# Patient Record
Sex: Female | Born: 1945
Health system: Southern US, Community
[De-identification: ages and names within clinical notes are randomized; demographics above are authoritative.]

## PROBLEM LIST (undated history)

## (undated) DIAGNOSIS — I10 Essential (primary) hypertension: Secondary | ICD-10-CM

## (undated) DIAGNOSIS — R740 Nonspecific elevation of levels of transaminase and lactic acid dehydrogenase [LDH]: Secondary | ICD-10-CM

## (undated) DIAGNOSIS — L570 Actinic keratosis: Secondary | ICD-10-CM

## (undated) DIAGNOSIS — R7402 Elevation of levels of lactic acid dehydrogenase (LDH): Secondary | ICD-10-CM

## (undated) DIAGNOSIS — E039 Hypothyroidism, unspecified: Secondary | ICD-10-CM

## (undated) DIAGNOSIS — F988 Other specified behavioral and emotional disorders with onset usually occurring in childhood and adolescence: Secondary | ICD-10-CM

## (undated) DIAGNOSIS — M1990 Unspecified osteoarthritis, unspecified site: Secondary | ICD-10-CM

## (undated) DIAGNOSIS — C4491 Basal cell carcinoma of skin, unspecified: Secondary | ICD-10-CM

## (undated) HISTORY — PX: REPLACEMENT TOTAL HIP W/  RESURFACING IMPLANTS: SUR1222

## (undated) HISTORY — DX: Actinic keratosis: L57.0

## (undated) HISTORY — DX: Other specified behavioral and emotional disorders with onset usually occurring in childhood and adolescence: F98.8

## (undated) HISTORY — DX: Basal cell carcinoma of skin, unspecified: C44.91

## (undated) HISTORY — PX: OTHER SURGICAL HISTORY: SHX169

---

## 1998-10-29 ENCOUNTER — Other Ambulatory Visit: Admission: RE | Admit: 1998-10-29 | Discharge: 1998-10-29 | Payer: Self-pay | Admitting: Obstetrics and Gynecology

## 1998-10-29 ENCOUNTER — Ambulatory Visit (HOSPITAL_COMMUNITY): Admission: RE | Admit: 1998-10-29 | Discharge: 1998-10-29 | Payer: Self-pay | Admitting: Vascular Surgery

## 1999-01-24 ENCOUNTER — Other Ambulatory Visit: Admission: RE | Admit: 1999-01-24 | Discharge: 1999-01-24 | Payer: Self-pay | Admitting: Obstetrics and Gynecology

## 2000-03-24 ENCOUNTER — Other Ambulatory Visit: Admission: RE | Admit: 2000-03-24 | Discharge: 2000-03-24 | Payer: Self-pay | Admitting: Obstetrics and Gynecology

## 2000-08-06 ENCOUNTER — Encounter: Payer: Self-pay | Admitting: Obstetrics and Gynecology

## 2000-08-06 ENCOUNTER — Ambulatory Visit (HOSPITAL_COMMUNITY): Admission: RE | Admit: 2000-08-06 | Discharge: 2000-08-06 | Payer: Self-pay | Admitting: Obstetrics and Gynecology

## 2001-10-25 ENCOUNTER — Encounter: Payer: Self-pay | Admitting: Obstetrics and Gynecology

## 2001-10-25 ENCOUNTER — Ambulatory Visit (HOSPITAL_COMMUNITY): Admission: RE | Admit: 2001-10-25 | Discharge: 2001-10-25 | Payer: Self-pay | Admitting: Obstetrics and Gynecology

## 2002-03-11 ENCOUNTER — Encounter: Admission: RE | Admit: 2002-03-11 | Discharge: 2002-03-11 | Payer: Self-pay | Admitting: Sports Medicine

## 2002-05-20 ENCOUNTER — Encounter: Admission: RE | Admit: 2002-05-20 | Discharge: 2002-05-20 | Payer: Self-pay | Admitting: Sports Medicine

## 2002-09-19 ENCOUNTER — Other Ambulatory Visit: Admission: RE | Admit: 2002-09-19 | Discharge: 2002-09-19 | Payer: Self-pay | Admitting: Obstetrics and Gynecology

## 2003-11-09 ENCOUNTER — Ambulatory Visit (HOSPITAL_COMMUNITY): Admission: RE | Admit: 2003-11-09 | Discharge: 2003-11-09 | Payer: Self-pay | Admitting: Obstetrics and Gynecology

## 2004-11-22 ENCOUNTER — Ambulatory Visit (HOSPITAL_COMMUNITY): Admission: RE | Admit: 2004-11-22 | Discharge: 2004-11-22 | Payer: Self-pay | Admitting: Obstetrics and Gynecology

## 2005-11-25 ENCOUNTER — Ambulatory Visit (HOSPITAL_COMMUNITY): Admission: RE | Admit: 2005-11-25 | Discharge: 2005-11-25 | Payer: Self-pay | Admitting: Obstetrics and Gynecology

## 2006-12-01 ENCOUNTER — Ambulatory Visit (HOSPITAL_COMMUNITY): Admission: RE | Admit: 2006-12-01 | Discharge: 2006-12-01 | Payer: Self-pay | Admitting: Obstetrics and Gynecology

## 2007-12-02 ENCOUNTER — Ambulatory Visit (HOSPITAL_COMMUNITY): Admission: RE | Admit: 2007-12-02 | Discharge: 2007-12-02 | Payer: Self-pay | Admitting: Obstetrics and Gynecology

## 2008-08-17 ENCOUNTER — Inpatient Hospital Stay (HOSPITAL_COMMUNITY): Admission: RE | Admit: 2008-08-17 | Discharge: 2008-08-20 | Payer: Self-pay | Admitting: Orthopedic Surgery

## 2008-12-04 ENCOUNTER — Ambulatory Visit (HOSPITAL_COMMUNITY): Admission: RE | Admit: 2008-12-04 | Discharge: 2008-12-04 | Payer: Self-pay | Admitting: Obstetrics and Gynecology

## 2009-04-18 ENCOUNTER — Other Ambulatory Visit: Admission: RE | Admit: 2009-04-18 | Discharge: 2009-04-18 | Payer: Self-pay | Admitting: Family Medicine

## 2009-04-23 ENCOUNTER — Ambulatory Visit (HOSPITAL_COMMUNITY): Admission: RE | Admit: 2009-04-23 | Discharge: 2009-04-23 | Payer: Self-pay | Admitting: Family Medicine

## 2009-12-06 ENCOUNTER — Ambulatory Visit (HOSPITAL_COMMUNITY): Admission: RE | Admit: 2009-12-06 | Discharge: 2009-12-06 | Payer: Self-pay | Admitting: Obstetrics and Gynecology

## 2010-08-02 IMAGING — CR DG CHEST 2V
2 series · 2 of 2 positions shown · non-contrast
Comparison: None available.

CLINICAL DATA: Osteoarthritis of the left hip.  Preop for surgery.

CHEST - 2 VIEW

[w chest pa]
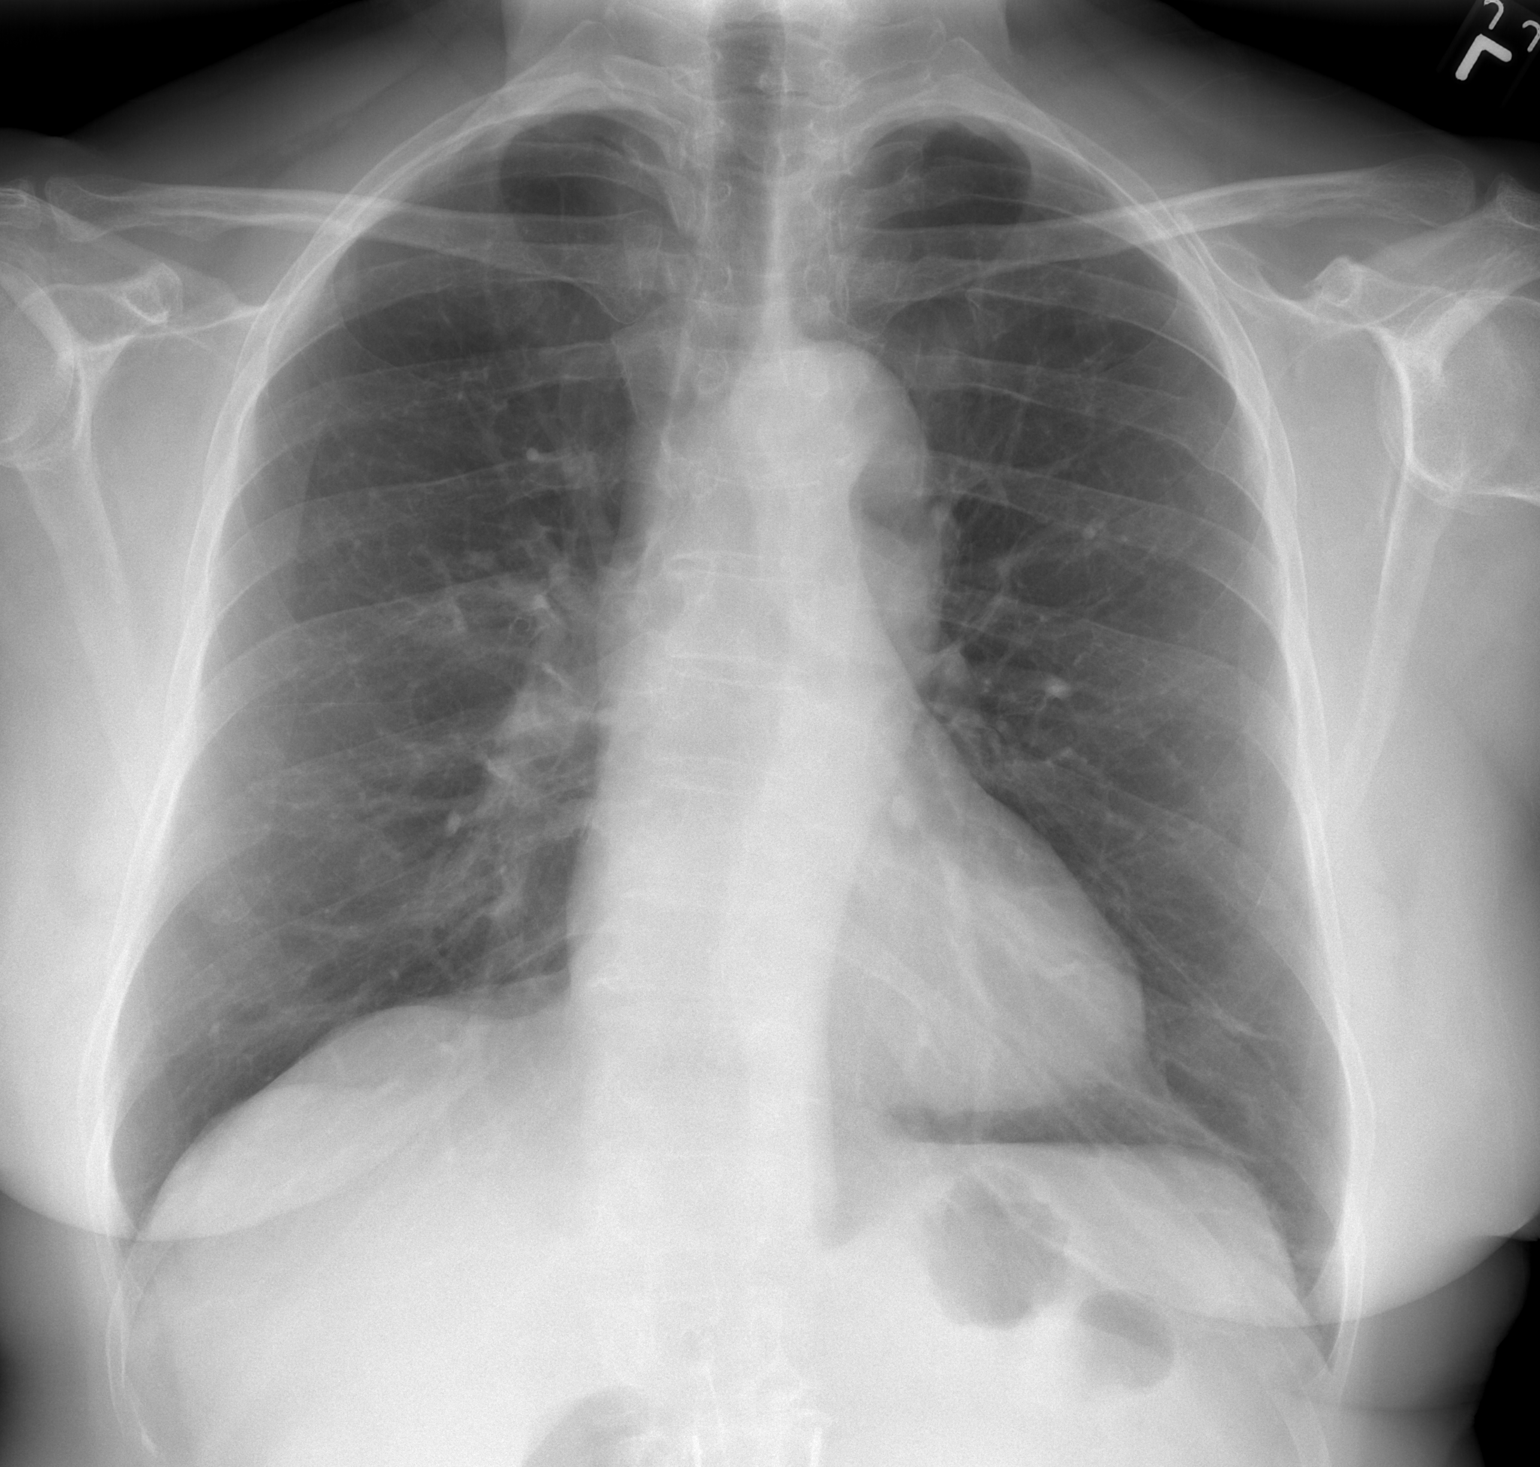

[w chest lat]
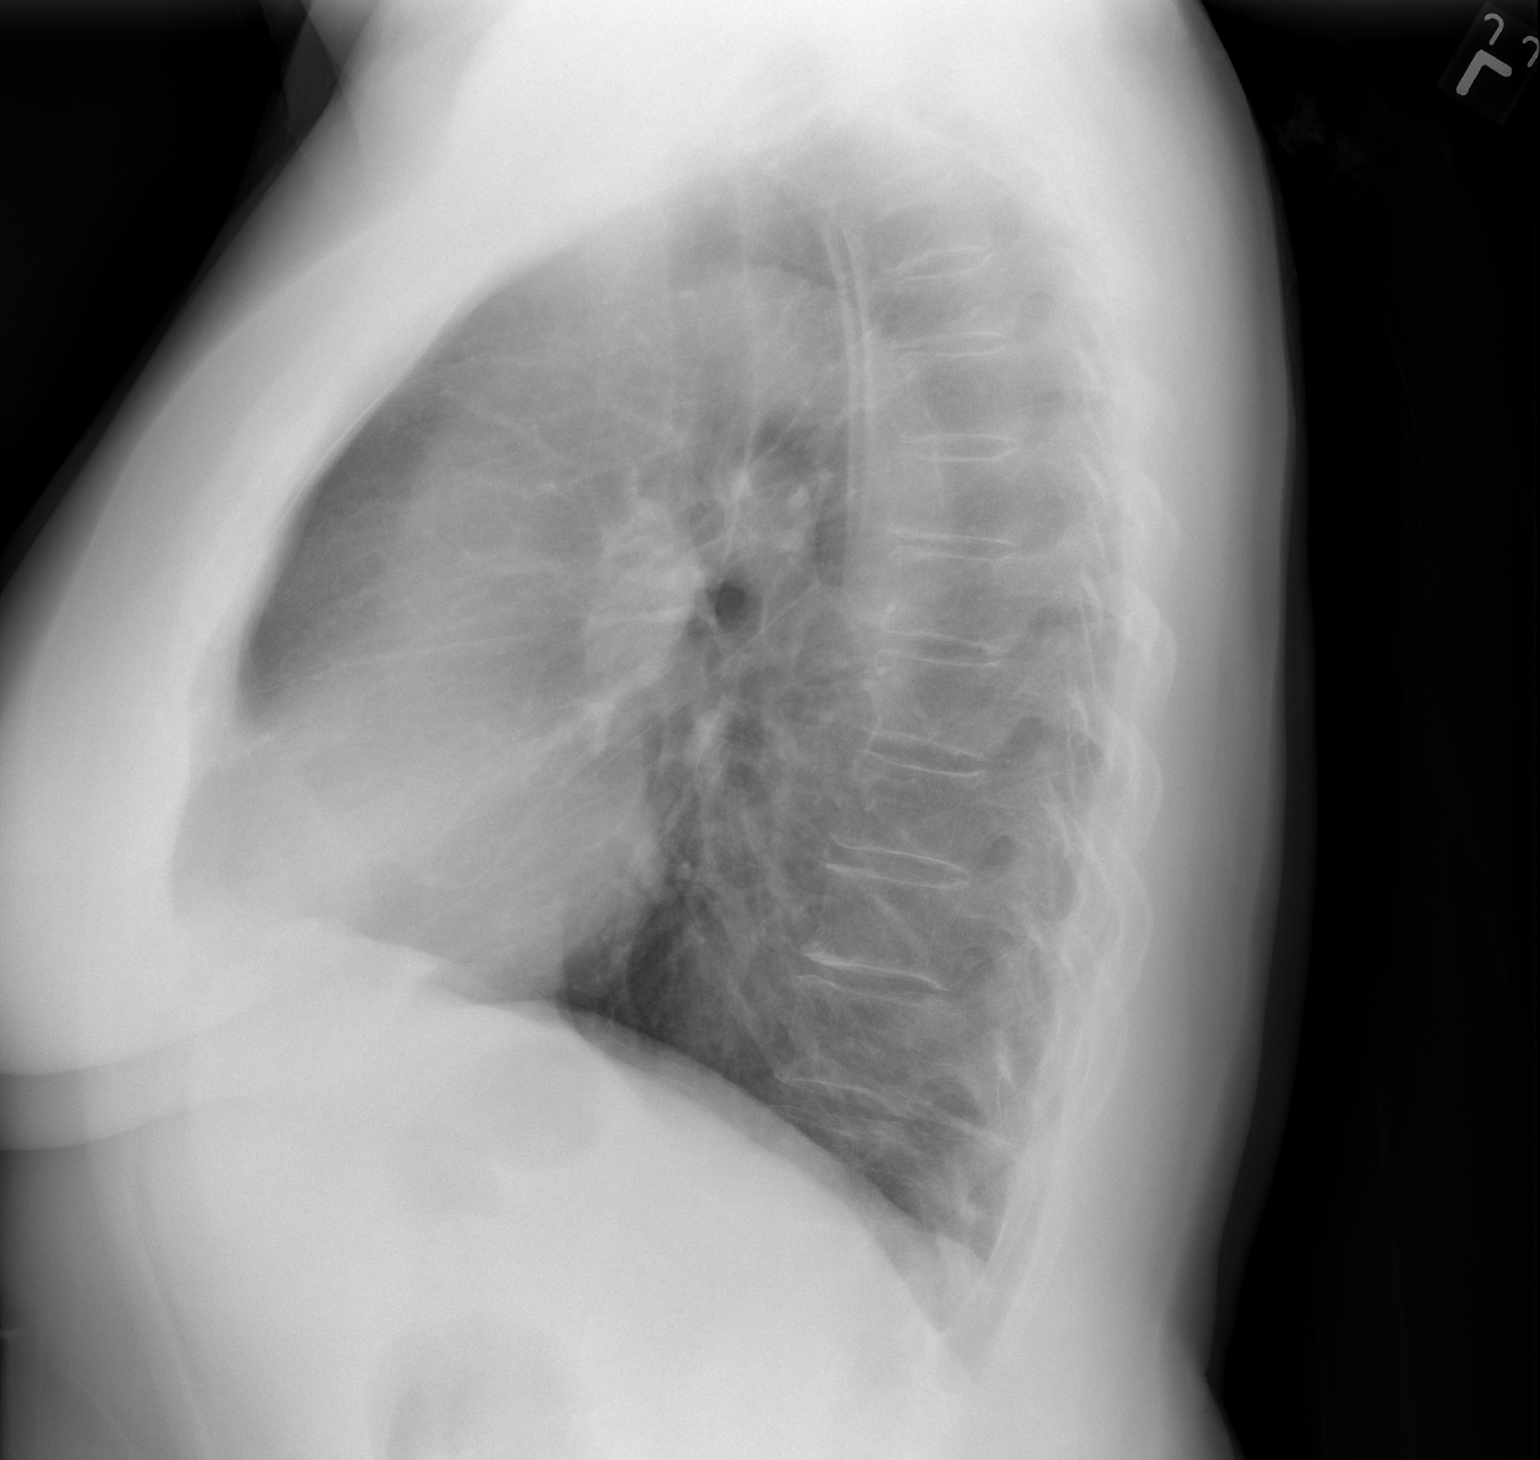

[2 of 2 positions shown; findings below may reference images not displayed]

FINDINGS: The cardiopericardial silhouette is within normal limits
for size.  Mild changes of COPD are evident.  No focal airspace
disease is noted.  A remote fracture of the left fifth rib is
evident.  The visualized soft tissues and bony thorax are otherwise
unremarkable.
IMPRESSION: 1.  No acute cardiopulmonary disease.
2.  Changes compatible with COPD.

## 2010-08-05 IMAGING — CR DG PORTABLE PELVIS
1 series · 1 of 1 positions shown · non-contrast
Comparison: None

CLINICAL DATA: Hip replacement

PORTABLE PELVIS

[view not recorded]
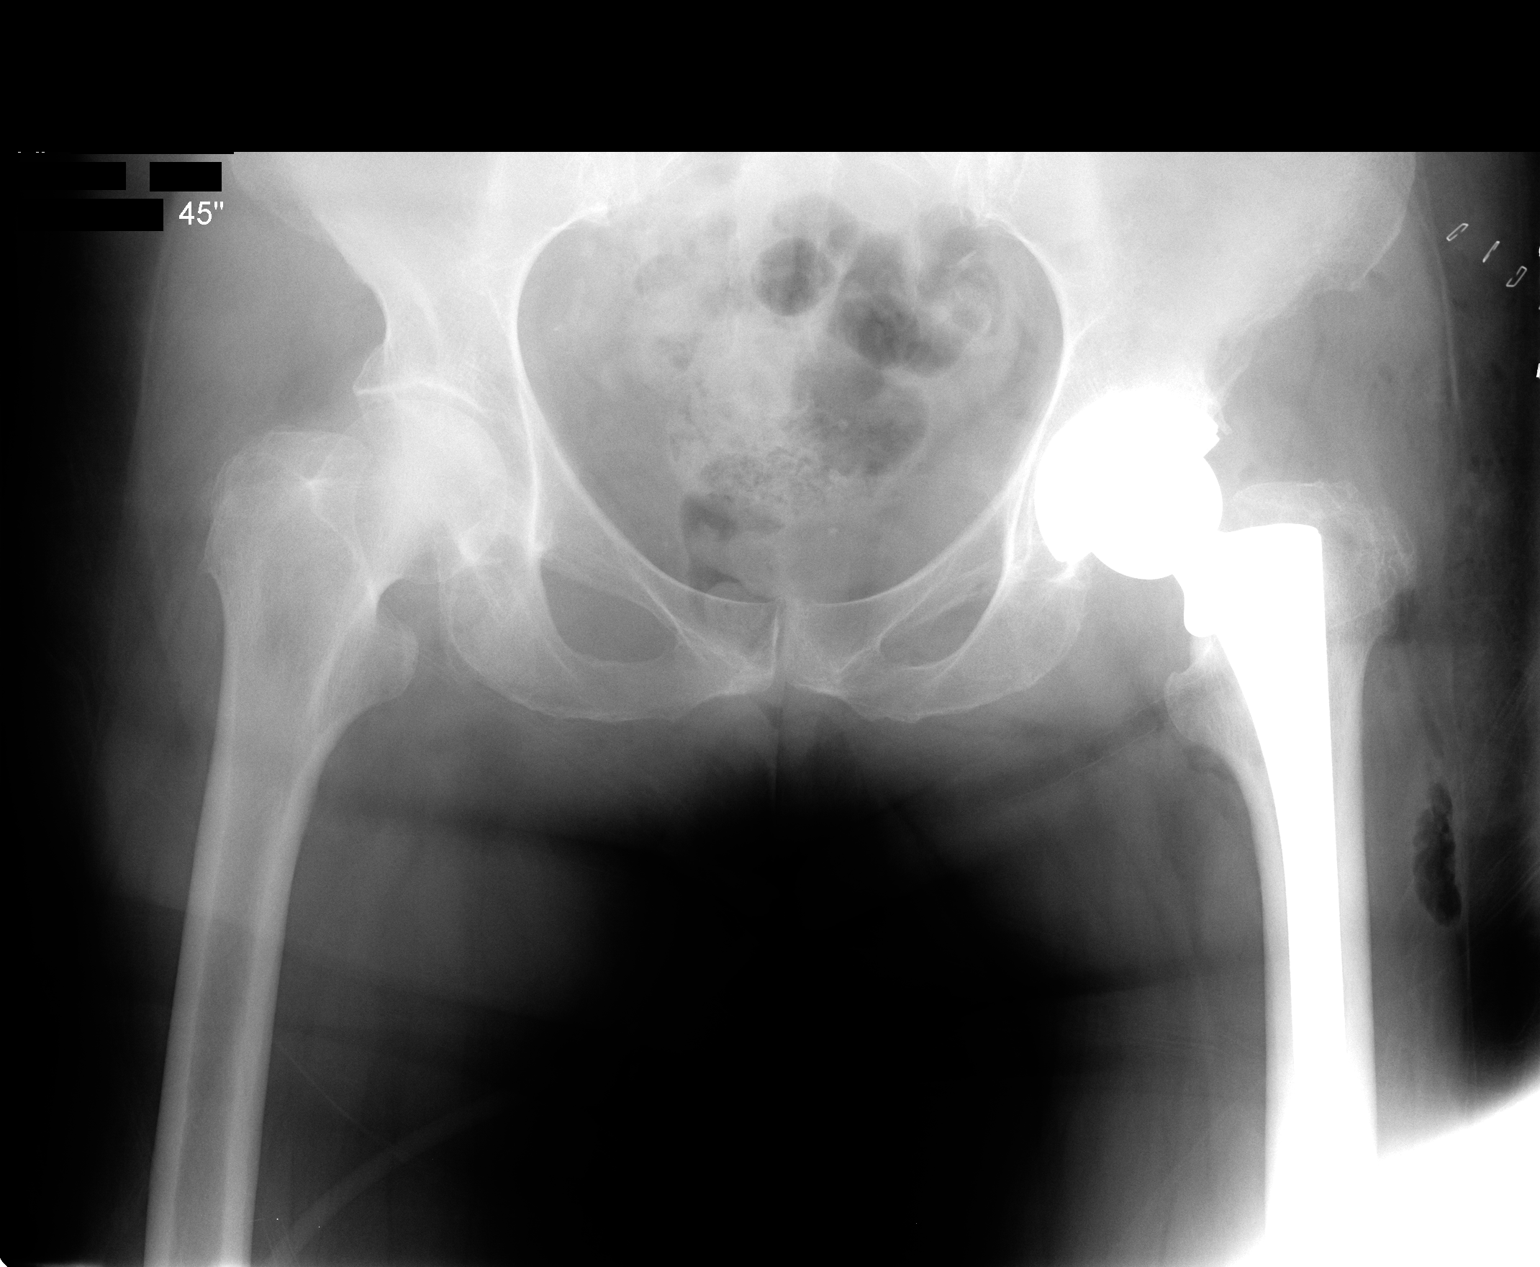

[1 of 1 positions shown; findings below may reference images not displayed]

FINDINGS: Left total hip arthroplasty is anatomically aligned.  No
breakage or loosening of the hardware.
IMPRESSION: Left total hip arthroplasty anatomically aligned.

## 2010-08-14 LAB — URINALYSIS, ROUTINE W REFLEX MICROSCOPIC
Bilirubin Urine: NEGATIVE
Ketones, ur: NEGATIVE mg/dL
Nitrite: POSITIVE — AB
Specific Gravity, Urine: 1.014 (ref 1.005–1.030)
Urobilinogen, UA: 0.2 mg/dL (ref 0.0–1.0)
pH: 6.5 (ref 5.0–8.0)

## 2010-08-14 LAB — CROSSMATCH: Antibody Screen: NEGATIVE

## 2010-08-14 LAB — DIFFERENTIAL
Basophils Relative: 1 % (ref 0–1)
Eosinophils Absolute: 0.3 10*3/uL (ref 0.0–0.7)
Lymphs Abs: 1.7 10*3/uL (ref 0.7–4.0)
Monocytes Absolute: 0.5 10*3/uL (ref 0.1–1.0)
Monocytes Relative: 7 % (ref 3–12)

## 2010-08-14 LAB — CBC
HCT: 23.7 % — ABNORMAL LOW (ref 36.0–46.0)
HCT: 29.6 % — ABNORMAL LOW (ref 36.0–46.0)
Hemoglobin: 8.1 g/dL — ABNORMAL LOW (ref 12.0–15.0)
Hemoglobin: 9.4 g/dL — ABNORMAL LOW (ref 12.0–15.0)
Hemoglobin: 13.6 g/dL (ref 12.0–15.0)
MCHC: 34.2 g/dL (ref 30.0–36.0)
MCHC: 34.4 g/dL (ref 30.0–36.0)
MCV: 92.2 fL (ref 78.0–100.0)
Platelets: 172 10*3/uL (ref 150–400)
Platelets: 223 10*3/uL (ref 150–400)
RBC: 2.91 MIL/uL — ABNORMAL LOW (ref 3.87–5.11)
RDW: 12.7 % (ref 11.5–15.5)
RDW: 14.1 % (ref 11.5–15.5)
RDW: 12.5 % (ref 11.5–15.5)
WBC: 8.9 10*3/uL (ref 4.0–10.5)

## 2010-08-14 LAB — BASIC METABOLIC PANEL
BUN: 3 mg/dL — ABNORMAL LOW (ref 6–23)
BUN: 4 mg/dL — ABNORMAL LOW (ref 6–23)
Chloride: 103 mEq/L (ref 96–112)
Chloride: 103 mEq/L (ref 96–112)
Creatinine, Ser: 0.5 mg/dL (ref 0.4–1.2)
GFR calc non Af Amer: >60 mL/min (ref >60)
Glucose, Bld: 118 mg/dL — ABNORMAL HIGH (ref 70–99)
Potassium: 3.7 mEq/L (ref 3.5–5.1)
Potassium: 4.3 mEq/L (ref 3.5–5.1)
Sodium: 136 mEq/L (ref 135–145)

## 2010-08-14 LAB — COMPREHENSIVE METABOLIC PANEL
ALT: 16 U/L (ref 0–35)
AST: 20 U/L (ref 0–37)
Albumin: 3.9 g/dL (ref 3.5–5.2)
Alkaline Phosphatase: 49 U/L (ref 39–117)
Chloride: 104 mEq/L (ref 96–112)
Potassium: 4.1 mEq/L (ref 3.5–5.1)
Sodium: 137 mEq/L (ref 135–145)
Total Bilirubin: 0.7 mg/dL (ref 0.3–1.2)

## 2010-08-14 LAB — ABO/RH: ABO/RH(D): A POS

## 2010-09-17 NOTE — Op Note (Signed)
NAMEELDORA, NAPP           ACCOUNT NO.:  [id number]   MEDICAL RECORD NO.:  [id number]          PATIENT TYPE:  INP   LOCATION:  0003                         FACILITY:  [facility]   PHYSICIAN:  John L. Rendall, M.D.  DATE OF BIRTH:  [birth year]   DATE OF PROCEDURE:  08/17/2008  DATE OF DISCHARGE:                               OPERATIVE REPORT   PREOPERATIVE DIAGNOSIS:  Osteoarthritis, left hip.   SURGICAL PROCEDURES:  Left AML total hip replacement.   POSTOPERATIVE DIAGNOSIS:  Osteoarthritis, left hip.   SURGEON:  John L. Rendall, M.D.   ASSISTANT:  Legrand Pitts. Duffy, PA-C, present and participating in the  entire procedure.   ANESTHESIA:  Spinal.   PATHOLOGY:  Bone against bone, left hip, with no unusual features noted  in the femoral head or the acetabulum, simply a worn-out hip.   PROCEDURE:  Under spinal anesthesia, the patient was placed in the right  lateral decubitus position and the hip was prepared with DuraPrep and  draped as a sterile field.  Posterior approach was made, splitting the  skin and subcutaneous tissue and the IT band in the line of its fibers.  A Charnley retractor is then inserted.  The short external rotators and  hip capsule are then taken down from the bone and the hip capsule is  opened.  The hip is dislocated.  The arthritis is confirmed.  The  superior femoral neck is exposed.  The IM initiator and canal finder are  used.  Progressive reaming to 13 mm is then done.  The femoral neck is  then osteotomized.  Rasps are used, 10/5 narrow, 12 narrow, 12 large,  and 13/5 large.  Calcar reamer is done to appropriately fine tune the  calcar.  Once this side is completed, attention is turned to the  acetabulum.  Two cobra retractors are placed inferiorly.  A Hibbs is  used superiorly until the 2 wings are put in place in the interval  between the capsule and the labrum.  With the wings in place, the labrum  is excised.  The ligamentum teres is found and  removed in the fovea.  The debridement is completed and reaming is then commenced with 43, 45,  47, 49, 51 reamers in sequence.  Trial seating of a 50 bottoms out  nicely, 52 seems to have a good rim fit.  At this point, the Pinnacle  acetabular cup 100 series, size 52, is inserted using the Sputnik to  assist in placement.  Basically, the anatomic alignment of the native  acetabulum is used for positioning as well.  This gives an excellent  solid scratch fit once fully inserted.  Trial seating of a Pinnacle  acetabular liner +4, 10-degree lip is then done with the rasp, 13/5  large, and a +1.5/36-mm hip ball.  With all of these in place, the hip  is stable through full normal range of motion.  Shuck test under spinal  is just about 8 mm.  The hip is fully stable with flexion and internal  rotation to 45 degrees, when you go to unit of 60 degrees, it has  a  tendency to sublux.  There is no evidence of impingement of the proximal  femur on the acetabulum in the normal position.  At this point, the  permanent components are obtained.  The apex hole eliminator is inserted  in the cup.  The Pinnacle Marathon acetabular liner is inserted, 36 mm  inside, 52 mm outside diameter.  The metal-on-metal femoral head  +1.5  with a 12/14 taper is then inserted, +36-mm hip ball.  The standard  large stature AML stem size 13.5 is then inserted.  An approximate 20-  degree anteversion is used.  The hip ball is then attached in the usual  manner once the stem is seated and the hip is reduced.  Range of motion  is again tested and found to be stable.  Hip capsule is then sutured  with #1 Tycron, the piriformis is put back with #1 Tycron, short  external rotators are loosely  resutured with #1 Tycron, and the IT band is closed with #1 Tycron  mattress sutures.  Subcu was closed in 2 layers with #1 Vicryl and 2-0  Vicryl, skin clips are applied.  Operative time about an hour and 10  minutes.  Leg lengths are  equal at the end of the case.  The patient  returned to recovery in good condition.      John L. Rendall, M.D.  Electronically Signed     JLR/MEDQ  D:  08/17/2008  T:  08/17/2008  Job:  [id number]

## 2010-09-20 NOTE — Discharge Summary (Signed)
Vanessa Heath, Vanessa Heath           ACCOUNT NO.:  [id number]   MEDICAL RECORD NO.:  [id number]          PATIENT TYPE:  INP   LOCATION:  1602                         FACILITY:  [facility]   PHYSICIAN:  John L. Rendall, M.D.  DATE OF BIRTH:  30-Apr-1946   DATE OF ADMISSION:  08/17/2008  DATE OF DISCHARGE:  08/20/2008                               DISCHARGE SUMMARY   ADMISSION DIAGNOSES:  1. End-stage osteoarthritis left hip.  2. Hypertension.  3. Hypothyroid.  4. Hypercholesterolemia.  5. History of benign positional vertigo.   DISCHARGE DIAGNOSES:  1. End-stage osteoarthritis left hip status post left total hip      arthroplasty.  2. Acute blood loss anemia secondary to surgery.  3. Hypertension.  4. Hypothyroidism.  5. Hypercholesterolemia.  6. History of benign positional vertigo.   SURGICAL PROCEDURES:  On August 17, 2008 Vanessa Heath underwent a left  total hip arthroplasty by Dr. Jonny Ruiz L.  Rendall assisted by Arnoldo Morale  PA-C.  Vanessa Heath had a Pinnacle 100 series acetabular cup size 52 mm with an  apex hole eliminator placed with a Pinnacle Marathon acetabular liner  +410 degrees, size #36 inner diameter, 52 mm outer diameter.  An AML  large stature 160 mm length, 45 mm offset size 13.5 femoral stem with a  metal-on-metal femoral head, +1.5 neck length, 36 mm head, 12-14 taper.   COMPLICATIONS:  None.   CONSULTS:  1. Case management consult August 18, 2008.  2. Physical therapy consult August 18, 2008.  3. Occupational therapy consult August 19, 2008.   HISTORY OF PRESENT ILLNESS:  A Vanessa Heath patient  presented to Vanessa Heath with a 80-month history of gradual onset  progressive left hip pain.  The pain is constant, sharp to nagging  sensation in the left groin and lateral trochanter with radiation into  the foot.  Pain increases with walking and decreases with Ultram and  decrease in activity.  Vanessa Heath cannot sleep on the left side.  No difficulty  with socks and shoes.   Vanessa Heath has had cortisone shots without relief.  Vanessa Heath  has failed conservative treatment, and because of that Vanessa Heath is presenting  for a left hip replacement.   HOSPITAL COURSE:  Vanessa Heath tolerated her surgical procedure well  without immediate postoperative complications.  Vanessa Heath was transferred to  the orthopedic floor.  Postop day #1 T-max 98.4, vitals were stable.  Hemoglobin 9.4, hematocrit 27.5.  Leg was neurovascularly intact.  Minimal drainage on her Mepilex dressing.  Her Foley was DC'd after  therapy, and Vanessa Heath was started on physical therapy per protocol.   Postop day #2 Vanessa Heath had a little dizziness and nausea with ambulation, and  her hemoglobin had dropped to 8 with hematocrit of 23.  Vanessa Heath was  subsequently transfused with 2 units of packed red blood cells.  Pulse  was also elevated at 108.  Vanessa Heath was continued on therapy.   Vanessa Heath tolerated her transfusion well.  On postop day #3, Vanessa Heath was feeling  much better.  Hemoglobin had improved to 10 with a 29 hematocrit.  It is  felt Vanessa Heath was stable for  discharge home, and was DC'd home later that  day.   DISCHARGE INSTRUCTIONS:  Diet:  Vanessa Heath can resume her regular  prehospitalization diet.   MEDICATIONS:  Vanessa Heath may resume her home meds as follows:   1. Lisinopril 20 mg p.o. q.a.m.  2. Levothroid 137 mcg 1 tablet p.o. q.a.m.  3. Vytorin 10/20 mg 1 tablet p.o. q.a.m.  4. Tramadol Vanessa Heath is to hold at this time while on other pain meds.   ADDITIONAL MEDICATIONS AT THIS TIME:  1. Arixtra 2.5 mg subcu q. 8 p.m. with last dose on April 24.  On the      28th Vanessa Heath is to start 1 baby aspirin by mouth daily for 1 month.  2. Norco 5/325 one to two tablets p.o. q.4 hours p.r.n. for pain, 60      with no refill.  3. Robaxin 500 mg 1-2 tablets p.o. q.6 hours p.r.n. for spasms, 60      with no refill.   ACTIVITY:  Vanessa Heath can be out of bed weightbearing as tolerated on the left  leg with the use of the  walker.  No lifting or driving for 6 weeks.  Vanessa Heath can increase  activity slowly.  Please see the blue total hip  discharge sheet for further activity instructions.   WOUND CARE:  Vanessa Heath may shower after no drainage from the wound for 2 days.  Please see the blue total hip discharge sheet for further wound care  instructions.   FOLLOWUP:  Vanessa Heath needs to follow up with Vanessa Heath in our office on  Tuesday, April 27, and needs to call [phone] for that appointment.   LABORATORY DATA:  Hemoglobin/hematocrit ranged from 13.6 and 40.2 on the  12th to 8.1 and 23.7 on the 17th to 10.2 and 29.6 on the 18th.  Glucose  ranged from 88 on the 12th to 122 on the 16th to 105 on the 18th.  Potassium ranged from 4.1 on the 12th to 3.3 on the 18th. Sodium went  from 137 on the 12th to 133 on the 17th, and BUN and creatinine went  from 8 and 0.57 on the 12th to 4 and 0.35 on the 17th to 3 and 0.5 on  the 18th.   Calcium dropped to a low of 8.0 on the 17th, and then went to 8.1.  Urinalysis on April 12 showed small hemoglobin, positive nitrate, large  leukocyte esterase, too numerous to count white cells, and many  bacteria.  Vanessa Heath was kept on Keflex postoperatively a bit longer due to  UTI.      Vanessa Heath, P.A.      John L. Rendall, M.D.  Electronically Signed    KED/MEDQ  D:  08/30/2008  T:  08/30/2008  Job:  [id number]

## 2010-11-25 ENCOUNTER — Other Ambulatory Visit (HOSPITAL_COMMUNITY): Payer: Self-pay | Admitting: Family Medicine

## 2010-11-25 DIAGNOSIS — Z1231 Encounter for screening mammogram for malignant neoplasm of breast: Secondary | ICD-10-CM

## 2010-12-09 ENCOUNTER — Ambulatory Visit (HOSPITAL_COMMUNITY)
Admission: RE | Admit: 2010-12-09 | Discharge: 2010-12-09 | Disposition: A | Discharge status: Home / Self Care | Payer: 59 | Source: Ambulatory Visit | Attending: Family Medicine | Admitting: Family Medicine

## 2010-12-09 DIAGNOSIS — Z1231 Encounter for screening mammogram for malignant neoplasm of breast: Secondary | ICD-10-CM | POA: Insufficient documentation

## 2011-11-26 ENCOUNTER — Other Ambulatory Visit (HOSPITAL_COMMUNITY): Payer: Self-pay | Admitting: Family Medicine

## 2011-11-26 DIAGNOSIS — Z1231 Encounter for screening mammogram for malignant neoplasm of breast: Secondary | ICD-10-CM

## 2011-11-27 ENCOUNTER — Ambulatory Visit (HOSPITAL_COMMUNITY)
Admission: RE | Admit: 2011-11-27 | Discharge: 2011-11-27 | Disposition: A | Discharge status: Home / Self Care | Payer: 59 | Source: Ambulatory Visit | Attending: Family Medicine | Admitting: Family Medicine

## 2011-11-27 DIAGNOSIS — Z1231 Encounter for screening mammogram for malignant neoplasm of breast: Secondary | ICD-10-CM | POA: Insufficient documentation

## 2012-11-08 ENCOUNTER — Other Ambulatory Visit (HOSPITAL_COMMUNITY): Payer: Self-pay | Admitting: Family Medicine

## 2012-11-08 DIAGNOSIS — Z1231 Encounter for screening mammogram for malignant neoplasm of breast: Secondary | ICD-10-CM

## 2012-11-29 ENCOUNTER — Ambulatory Visit (HOSPITAL_COMMUNITY)
Admission: RE | Admit: 2012-11-29 | Discharge: 2012-11-29 | Disposition: A | Discharge status: Home / Self Care | Payer: 59 | Source: Ambulatory Visit | Attending: Family Medicine | Admitting: Family Medicine

## 2012-11-29 DIAGNOSIS — Z1231 Encounter for screening mammogram for malignant neoplasm of breast: Secondary | ICD-10-CM | POA: Insufficient documentation

## 2013-10-24 ENCOUNTER — Other Ambulatory Visit (HOSPITAL_COMMUNITY): Payer: Self-pay | Admitting: Family Medicine

## 2013-10-24 DIAGNOSIS — Z1231 Encounter for screening mammogram for malignant neoplasm of breast: Secondary | ICD-10-CM

## 2013-12-01 ENCOUNTER — Ambulatory Visit (HOSPITAL_COMMUNITY)
Admission: RE | Admit: 2013-12-01 | Discharge: 2013-12-01 | Disposition: A | Discharge status: Home / Self Care | Payer: Medicare Other | Source: Ambulatory Visit | Attending: Family Medicine | Admitting: Family Medicine

## 2013-12-01 DIAGNOSIS — Z1231 Encounter for screening mammogram for malignant neoplasm of breast: Secondary | ICD-10-CM | POA: Insufficient documentation

## 2014-11-21 ENCOUNTER — Other Ambulatory Visit (HOSPITAL_COMMUNITY): Payer: Self-pay | Admitting: Family Medicine

## 2014-11-21 DIAGNOSIS — Z1231 Encounter for screening mammogram for malignant neoplasm of breast: Secondary | ICD-10-CM

## 2014-12-04 ENCOUNTER — Ambulatory Visit (HOSPITAL_COMMUNITY)
Admission: RE | Admit: 2014-12-04 | Discharge: 2014-12-04 | Disposition: A | Discharge status: Home / Self Care | Payer: Medicare Other | Source: Ambulatory Visit | Attending: Family Medicine | Admitting: Family Medicine

## 2014-12-04 DIAGNOSIS — Z1231 Encounter for screening mammogram for malignant neoplasm of breast: Secondary | ICD-10-CM | POA: Insufficient documentation

## 2015-11-05 ENCOUNTER — Ambulatory Visit (INDEPENDENT_AMBULATORY_CARE_PROVIDER_SITE_OTHER): Payer: Medicare Other | Admitting: Internal Medicine

## 2015-11-05 VITALS — BP 120/82 | HR 86 | Temp 98.3°F | Resp 18 | Ht 62.0 in | Wt 170.0 lb

## 2015-11-05 DIAGNOSIS — S61402A Unspecified open wound of left hand, initial encounter: Secondary | ICD-10-CM

## 2015-11-05 DIAGNOSIS — Z23 Encounter for immunization: Secondary | ICD-10-CM | POA: Diagnosis not present

## 2015-11-05 DIAGNOSIS — S61255A Open bite of left ring finger without damage to nail, initial encounter: Secondary | ICD-10-CM | POA: Diagnosis not present

## 2015-11-05 DIAGNOSIS — W540XXA Bitten by dog, initial encounter: Secondary | ICD-10-CM

## 2015-11-05 MED ORDER — AMOXICILLIN-POT CLAVULANATE 875-125 MG PO TABS: 1.0000 | ORAL_TABLET | Freq: Two times a day (BID) | ORAL | Status: DC

## 2015-11-05 NOTE — Progress Notes (Signed)
PROCEDURE NOTE: laceration repair Verbal consent obtained from patient.  Local anesthesia with 4cc Lidocaine 2% with epinephrine.  Wound explored for tendon, ligament damage. Wound scrubbed with soap and water and rinsed. Wound closed with #3 4-0 Ethilon simple interrupted sutures.  Wound cleansed and dressed.  Benjiman CoreBrittany Brogen Duell, PA-C  [facility] 11/05/2015 7:31 PM

## 2015-11-05 NOTE — Progress Notes (Signed)
   Subjective:    Patient ID: Vanessa Heath, female    DOB: [birth year], 70 y.o.   MRN: [id number]  HPI Chief Complaint  Patient presents with  . Finger Injury    dog bite today   1 h ago  Finger 4 L palmar Known dog with all shots provoked in dog's territory  No recent Td  Review of Systems [place]    Objective:   Physical Exam BP 120/82 mmHg  Pulse 86  Temp(Src) 98.3 F (36.8 C) (Oral)  Resp 18  Ht 5\' 2"  (1.575 m)  Wt 170 lb (77.111 kg)  BMI 31.09 kg/m2  SpO2 96% There is a wound on the volar aspect of the left fourth finger  over the proximal middle phalanx The wound is gaping with that exposure the no exposure of tendon or joint There is full flexion against resistance without pain or loss of function Range of motion is normal No distal sensory loss There is a long superficial scratch on the dorsum of this fourth finger      Assessment & Plan:  Wound finger Dog bite  Meds ordered this encounter  Medications  . amoxicillin-clavulanate (AUGMENTIN) 875-125 MG tablet    Sig: Take 1 tablet by mouth 2 (two) times daily.    Dispense:  14 tablet    Refill:  0   Wound care  SR 7-10d

## 2015-11-05 NOTE — Patient Instructions (Addendum)
   WOUND CARE Please return in 7-10 days to have your stitches/staples removed or sooner if you have concerns. . Keep area clean and dry for 24 hours. Do not remove bandage, if applied. . After 24 hours, remove bandage and wash wound gently with mild soap and warm water. Reapply a new bandage after cleaning wound, if directed. . Continue daily cleansing with soap and water until stitches/staples are removed. . Do not apply any ointments or creams to the wound while stitches/staples are in place, as this may cause delayed healing. . Notify the office if you experience any of the following signs of infection: Swelling, redness, pus drainage, streaking, fever >101.0 F . Notify the office if you experience excessive bleeding that does not stop after 15-20 minutes of constant, firm pressure.    IF you received an x-ray today, you will receive an invoice from [facility]. Please contact [facility] at [phone] with questions or concerns regarding your invoice.   IF you received labwork today, you will receive an invoice from [facility]. Please contact [facility] at [phone] with questions or concerns regarding your invoice.   Our billing staff will not be able to assist you with questions regarding bills from these companies.  You will be contacted with the lab results as soon as they are available. The fastest way to get your results is to activate your My Chart account. Instructions are located on the last page of this paperwork. If you have not heard from us regarding the results in 2 weeks, please contact this office.      

## 2015-11-14 ENCOUNTER — Other Ambulatory Visit: Payer: Self-pay | Admitting: Family Medicine

## 2015-11-14 DIAGNOSIS — Z1231 Encounter for screening mammogram for malignant neoplasm of breast: Secondary | ICD-10-CM

## 2015-12-05 ENCOUNTER — Ambulatory Visit
Admission: RE | Admit: 2015-12-05 | Discharge: 2015-12-05 | Disposition: A | Discharge status: Home / Self Care | Payer: Medicare Other | Source: Ambulatory Visit | Attending: Family Medicine | Admitting: Family Medicine

## 2015-12-05 DIAGNOSIS — Z1231 Encounter for screening mammogram for malignant neoplasm of breast: Secondary | ICD-10-CM

## 2016-11-07 ENCOUNTER — Other Ambulatory Visit: Payer: Self-pay | Admitting: Family Medicine

## 2016-11-07 DIAGNOSIS — Z1231 Encounter for screening mammogram for malignant neoplasm of breast: Secondary | ICD-10-CM

## 2016-12-09 ENCOUNTER — Ambulatory Visit
Admission: RE | Admit: 2016-12-09 | Discharge: 2016-12-09 | Disposition: A | Discharge status: Home / Self Care | Payer: Medicare Other | Source: Ambulatory Visit | Attending: Family Medicine | Admitting: Family Medicine

## 2016-12-09 DIAGNOSIS — Z1231 Encounter for screening mammogram for malignant neoplasm of breast: Secondary | ICD-10-CM

## 2017-04-17 ENCOUNTER — Emergency Department (HOSPITAL_COMMUNITY): Payer: Medicare Other

## 2017-04-17 ENCOUNTER — Ambulatory Visit: Payer: Self-pay | Admitting: *Deleted

## 2017-04-17 ENCOUNTER — Other Ambulatory Visit: Payer: Self-pay

## 2017-04-17 ENCOUNTER — Emergency Department (HOSPITAL_COMMUNITY)
Admission: EM | Admit: 2017-04-17 | Discharge: 2017-04-17 | Disposition: A | Discharge status: Home / Self Care | Payer: Medicare Other | Attending: Emergency Medicine | Admitting: Emergency Medicine

## 2017-04-17 ENCOUNTER — Encounter (HOSPITAL_COMMUNITY): Payer: Self-pay

## 2017-04-17 DIAGNOSIS — Y939 Activity, unspecified: Secondary | ICD-10-CM | POA: Insufficient documentation

## 2017-04-17 DIAGNOSIS — S0181XA Laceration without foreign body of other part of head, initial encounter: Secondary | ICD-10-CM | POA: Insufficient documentation

## 2017-04-17 DIAGNOSIS — W01198A Fall on same level from slipping, tripping and stumbling with subsequent striking against other object, initial encounter: Secondary | ICD-10-CM | POA: Diagnosis not present

## 2017-04-17 DIAGNOSIS — Z79899 Other long term (current) drug therapy: Secondary | ICD-10-CM | POA: Insufficient documentation

## 2017-04-17 DIAGNOSIS — I10 Essential (primary) hypertension: Secondary | ICD-10-CM | POA: Insufficient documentation

## 2017-04-17 DIAGNOSIS — Y92009 Unspecified place in unspecified non-institutional (private) residence as the place of occurrence of the external cause: Secondary | ICD-10-CM | POA: Diagnosis not present

## 2017-04-17 DIAGNOSIS — Y998 Other external cause status: Secondary | ICD-10-CM | POA: Insufficient documentation

## 2017-04-17 DIAGNOSIS — Z96649 Presence of unspecified artificial hip joint: Secondary | ICD-10-CM | POA: Insufficient documentation

## 2017-04-17 DIAGNOSIS — S0990XA Unspecified injury of head, initial encounter: Secondary | ICD-10-CM | POA: Diagnosis present

## 2017-04-17 HISTORY — DX: Essential (primary) hypertension: I10

## 2017-04-17 HISTORY — DX: Elevation of levels of lactic acid dehydrogenase (LDH): R74.02

## 2017-04-17 HISTORY — DX: Nonspecific elevation of levels of transaminase and lactic acid dehydrogenase (ldh): R74.0

## 2017-04-17 HISTORY — DX: Unspecified osteoarthritis, unspecified site: M19.90

## 2017-04-17 MED ORDER — BACITRACIN ZINC 500 UNIT/GM EX OINT
TOPICAL_OINTMENT | CUTANEOUS | Status: AC
  Filled 2017-04-17: qty 0.9

## 2017-04-17 MED ORDER — CEPHALEXIN 500 MG PO CAPS: 500.0000 mg | ORAL_CAPSULE | Freq: Four times a day (QID) | ORAL | 0 refills | Status: DC

## 2017-04-17 MED ORDER — LIDOCAINE-EPINEPHRINE (PF) 2 %-1:200000 IJ SOLN
20.0000 mL | Freq: Once | INTRAMUSCULAR | Status: AC
  Administered 2017-04-17: 20 mL
  Filled 2017-04-17: qty 20

## 2017-04-17 NOTE — ED Provider Notes (Signed)
..  Laceration Repair Date/Time: 04/17/2017 8:44 PM Performed by: Shaune PollackIsaacs, Keric Zehren, MD Authorized by: Shaune PollackIsaacs, Lawerence Dery, MD   Consent:    Consent obtained:  Verbal   Consent given by:  Patient   Risks discussed:  Infection, need for additional repair, pain, tendon damage, retained foreign body, vascular damage, poor cosmetic result, poor wound healing and nerve damage   Alternatives discussed:  Referral and delayed treatment Anesthesia (see MAR for exact dosages):    Anesthesia method:  Local infiltration   Local anesthetic:  Lidocaine 1% WITH epi Laceration details:    Location:  Face   Face location:  Forehead   Length (cm):  8 Repair type:    Repair type:  Intermediate Pre-procedure details:    Preparation:  Patient was prepped and draped in usual sterile fashion and imaging obtained to evaluate for foreign bodies Exploration:    Hemostasis achieved with:  Direct pressure   Wound exploration: wound explored through full range of motion and entire depth of wound probed and visualized     Wound extent: fascia violated and muscle damage     Wound extent: no underlying fracture noted     Contaminated: no   Treatment:    Area cleansed with:  Betadine   Amount of cleaning:  Extensive   Irrigation solution:  Sterile water   Irrigation method:  Pressure wash Subcutaneous repair:    Suture size:  4-0   Suture material:  Vicryl   Suture technique:  Simple interrupted   Number of sutures:  3 Skin repair:    Repair method:  Sutures   Suture size:  5-0   Suture material:  Prolene   Suture technique:  Simple interrupted   Number of sutures:  19 Approximation:    Approximation:  Close   Vermilion border: well-aligned   Post-procedure details:    Dressing:  Antibiotic ointment   Patient tolerance of procedure:  Tolerated well, no immediate complications      Shaune PollackIsaacs, Abeera Flannery, MD 04/17/17 2045

## 2017-04-17 NOTE — ED Notes (Signed)
Bacitracin applied per dr Penne Lashissacs

## 2017-04-17 NOTE — ED Provider Notes (Signed)
[facility]-EMERGENCY DEPT Provider Note   CSN: [id number] Arrival date & time: 04/17/17  1024     History   Chief Complaint Chief Complaint  Patient presents with  . forehead laceration    HPI Vanessa Heath is a 71 y.o. female.  HPI  Patient is a 71 year old female with a history of arthritis and hypertension presenting for a forehead laceration after a fall.  Patient reports that her shoes got caught on the rug at her friend's house and she fell forward and hit her head on a granite countertop.  There is no presyncopal feeling, dizziness, or lightheadedness.  Patient did not lose consciousness.  Patient does not have a headache at present.  Ice was applied immediately to the site.  Physically, patient does not have any blurred vision, double vision, numbness, tingling, weakness in extremities, nausea, vomiting.  Patient denies neck pain.  Patient does take diclofenac, but no blood thinning medications.  Tetanus shot is up-to-date within 2 years.  Past Medical History:  Diagnosis Date  . Arthritis   . Elevated LDH   . Hypertension     There are no active problems to display for this patient.   Past Surgical History:  Procedure Laterality Date  . REPLACEMENT TOTAL HIP W/  RESURFACING IMPLANTS    . skin cancer removal      OB History    No data available       Home Medications    Prior to Admission medications   Medication Sig Start Date End Date Taking? Authorizing Provider  acetaminophen (TYLENOL) 500 MG tablet Take 500-1,000 mg by mouth 2 (two) times daily as needed.   Yes [provider]  cycloSPORINE (RESTASIS) 0.05 % ophthalmic emulsion Place 1 drop into both eyes 2 (two) times daily.   Yes [provider]  diclofenac (VOLTAREN) 75 MG EC tablet Take 75 mg by mouth 2 (two) times daily as needed. 01/26/17  Yes [provider]  ezetimibe (ZETIA) 10 MG tablet Take 10 mg by mouth daily. 02/15/17  Yes [provider]  gabapentin (NEURONTIN) 300 MG capsule Take 600 mg by mouth at bedtime. 02/23/17  Yes [provider]  levothyroxine (SYNTHROID, LEVOTHROID) 137 MCG tablet Take 137 mcg by mouth daily before breakfast.   Yes [provider]  lisinopril (PRINIVIL,ZESTRIL) 20 MG tablet Take 20 mg by mouth daily.   Yes [provider]  magnesium oxide (MAG-OX) 400 MG tablet Take 400 mg by mouth daily.   Yes [provider]  Multiple Vitamins-Calcium (ONE-A-DAY WOMENS FORMULA PO) Take 1 tablet by mouth daily.   Yes [provider]  simvastatin (ZOCOR) 20 MG tablet Take 20 mg by mouth daily. 02/15/17  Yes [provider]  amoxicillin-clavulanate (AUGMENTIN) 875-125 MG tablet Take 1 tablet by mouth 2 (two) times daily. Patient not taking: Reported on 04/17/2017 11/05/15   Tonye Pearsonoolittle, Robert P, MD  cephALEXin (KEFLEX) 500 MG capsule Take 1 capsule (500 mg total) by mouth 4 (four) times daily. 04/17/17   Elisha PonderMurray, Tesla Bochicchio B, PA-C    Family History History reviewed. No pertinent family history.  Social History Social History   Tobacco Use  . Smoking status: Never Smoker  . Smokeless tobacco: Never Used  Substance Use Topics  . Alcohol use: Yes    Alcohol/week: 0.0 oz    Comment: socially  . Drug use: No     Allergies   Patient has no known allergies.   Review of Systems Review  of Systems  Constitutional: Negative for chills and fever.  HENT: Negative for congestion and sore throat.   Eyes: Negative for visual disturbance.  Respiratory: Negative for cough, chest tightness and shortness of breath.   Cardiovascular: Negative for chest pain.  Gastrointestinal: Negative for abdominal pain, nausea and vomiting.  Skin: Positive for wound.  Neurological: Negative for dizziness, syncope, speech difficulty, weakness, light-headedness, numbness and headaches.  All other systems reviewed and are negative.    Physical Exam Updated Vital  Signs BP (!) 142/100   Pulse 79   Temp 97.6 F (36.4 C) (Oral)   Resp 18   Ht 5' 2.5" (1.588 m)   Wt 75.3 kg (166 lb)   SpO2 98%   BMI 29.88 kg/m   Physical Exam  Constitutional: She appears well-developed and well-nourished. No distress.  HENT:  Head: Normocephalic and atraumatic.  Mouth/Throat: Oropharynx is clear and moist.  No hemotympanum bilaterally.  No battle sign.  Eyes: Conjunctivae and EOM are normal. Pupils are equal, round, and reactive to light.  Neck: Normal range of motion. Neck supple.  Cardiovascular: Normal rate, regular rhythm, S1 normal and S2 normal.  No murmur heard. Pulmonary/Chest: Effort normal and breath sounds normal. She has no wheezes. She has no rales.  Abdominal: Soft. She exhibits no distension. There is no tenderness. There is no guarding.  Musculoskeletal: Normal range of motion. She exhibits no edema or deformity.  Lymphadenopathy:    She has no cervical adenopathy.  Neurological: She is alert.  Mental Status:  Alert, oriented, thought content appropriate, able to give a coherent history. Speech fluent without evidence of aphasia. Able to follow 2 step commands without difficulty.  Cranial Nerves:  II:  Peripheral visual fields grossly normal, pupils equal, round, reactive to light III,IV, VI: ptosis not present, extra-ocular motions intact bilaterally  V,VII: smile symmetric, facial light touch sensation equal VIII: hearing grossly normal to voice  X: uvula elevates symmetrically  XI: bilateral shoulder shrug symmetric and strong XII: midline tongue extension without fassiculations Motor:  Normal tone. 5/5 in upper and lower extremities bilaterally including strong and equal grip strength and dorsiflexion/plantar flexion Sensory: Light touch normal in all extremities.  Cerebellar: normal finger-to-nose with bilateral upper extremities Gait: normal gait and balance Stance: No pronator drift and good coordination, strength, and position  sense with tapping of bilateral arms (performed in sitting position).  Skin: Skin is warm and dry.  There is a laceration extending across the superior right forehead.  This extends down to the glabella.  There is disturbance of the fascial layer as well as the frontalis muscle.  Patient has sensation surrounding the laceration.  Psychiatric: She has a normal mood and affect. Her behavior is normal. Judgment and thought content normal.  Nursing note and vitals reviewed.    ED Treatments / Results  Labs (all labs ordered are listed, but only abnormal results are displayed) Labs Reviewed - No data to display  EKG  EKG Interpretation None       Radiology Ct Head Wo Contrast  Result Date: 04/17/2017 CLINICAL DATA:  71 year old female presents after fall and hitting head on counter top. Presents with laceration of the forehead. EXAM: CT HEAD WITHOUT CONTRAST TECHNIQUE: Contiguous axial images were obtained from the base of the skull through the vertex without intravenous contrast. COMPARISON:  None. FINDINGS: Brain: Age expected involutional changes. Likely remote small vessel ischemic change of periventricular white matter. No acute intracranial hemorrhage, midline shift or edema. No intra-axial mass nor  extra-axial fluid collections. No hydrocephalus. No effacement of the basal cisterns or fourth ventricle. The cerebellum is unremarkable. Vascular: No hyperdense vessel or unexpected calcification. Skull: Negative for fracture. Sinuses/Orbits: Partial opacification of the right frontal sinus likely representing chronic frontal sinusitis. The remainder of the paranasal sinuses and mastoids are clear. Intact orbits and globes. No nasal bone fracture. Other: Forehead soft tissue swelling with laceration. IMPRESSION: 1. Soft tissue swelling/ contusion of the forehead with associated laceration. No underlying skull fracture. 2. Chronic mucosal thickening of the right frontal sinus. 3. Chronic  appearing small vessel ischemic disease of white matter. No acute intracranial abnormality. Electronically Signed   By: Tollie Eth M.D.   On: 04/17/2017 13:34    Procedures Procedures (including critical care time)  Medications Ordered in ED Medications  bacitracin 500 UNIT/GM ointment (not administered)  lidocaine-EPINEPHrine (XYLOCAINE W/EPI) 2 %-1:200000 (PF) injection 20 mL (20 mLs Infiltration Given 04/17/17 1411)     Initial Impression / Assessment and Plan / ED Course  I have reviewed the triage vital signs and the nursing notes.  Pertinent labs & imaging results that were available during my care of the patient were reviewed by me and considered in my medical decision making (see chart for details).      Final Clinical Impressions(s) / ED Diagnoses   Final diagnoses:  Laceration of forehead, initial encounter   Patient is well-appearing, in no acute distress, and completely neurologically intact.  Due to the extent of the laceration, CT of the head ordered.  Patient in no pain at this time.  CT of the head without acute abnormality demonstrating intracranial hemorrhage.  3 layer closure performed per Dr. Erma Heritage with 30 sutures.  Close approximation achieved.  Patient given post laceration care instructions.  Patient instructed to follow-up for suture removal in 5 days.  Patient given follow-up for plastic surgery to reevaluate the wound if there is any dimpling of the skin.  Return precautions given for any erythema, drainage, or signs of head injury such as persistent vomiting, slurred speech, decreased mental status, numbness, tingling, or weakness of extremities.  Patient is in understanding and agrees with the plan of care.  This is a shared visit with Dr. Shaune Pollack. Patient was independently evaluated by this attending physician. Attending physician consulted in evaluation and discharge management.  ED Discharge Orders        Ordered    cephALEXin (KEFLEX) 500 MG  capsule  4 times daily     04/17/17 1545       Delia Chimes 04/17/17 1800    Shaune Pollack, MD 04/17/17 2046

## 2017-04-17 NOTE — ED Triage Notes (Signed)
Patient states she tripped and hit her head on a granite counter top. Patient states she did not hve LOC nor does she take blood thinners.

## 2017-04-17 NOTE — Discharge Instructions (Signed)
Please see the information and instructions below regarding your visit.  Your diagnoses today include:  1. Laceration of forehead, initial encounter    You will require suture removal in approximately 5 days.  You may return here or at her primary care provider.  Tests performed today include: CT scan of the head demonstrates no bleeding. Vital signs. See below for your results today.   Medications prescribed:   Take any prescribed medications only as directed.  Tylenol for pain.   Please take all of your antibiotics until finished.   You may develop abdominal discomfort or nausea from the antibiotic. If this occurs, you may take it with food. Some patients also get diarrhea with antibiotics. You may help offset this with probiotics which you can buy or get in yogurt. Do not eat or take the probiotics until 2 hours after your antibiotic. Some women develop vaginal yeast infections after antibiotics. If you develop unusual vaginal discharge after being on this medication, please see your primary care provider.   Some people develop allergies to antibiotics. Symptoms of antibiotic allergy can be mild and include a flat rash and itching. They can also be more serious and include:  ?Hives - Hives are raised, red patches of skin that are usually very itchy.  ?Lip or tongue swelling  ?Trouble swallowing or breathing  ?Blistering of the skin or mouth.  If you have any of these serious symptoms, please seek emergency medical care immediately.   Home care instructions:  Follow any educational materials and wound care instructions contained in this packet.   Keep affected area above the level of your heart when possible to minimize swelling. Wash area gently twice a day with warm soapy water. Do not apply alcohol or hydrogen peroxide directly over a wound. Cover the area if it is draining or weeping. Keep the bandage in place for 24 hours and refrain from getting the wound wet for 24 hours.  After that, you may get the area wet, but please ensure that you dry it completely afterwards.  Please refrain from soaking sutures for long periods of time, or swimming in chlorinated water   You may apply antibiotic ointment such as Bacitracin or Neosporin.  Follow-up instructions: Suture Removal: Return to the Emergency Department or see your primary care care doctor in 5 days for a recheck of your wound and removal of your sutures or staples.    Return instructions:  Return to the Emergency Department if you have: Fever Worsening pain Worsening swelling of the wound Pus draining from the wound Redness of the skin that moves away from the wound, especially if it streaks away from the affected area  Any other emergent concerns For  your head injury: Please return to the emergency department for any decreasing level of consciousness, difficulty speaking, difficulty swallowing, numbness or weakness in her extremities, or persistent vomiting.  Your vital signs today were: BP (!) 145/98    Pulse 67    Temp 97.6 F (36.4 C) (Oral)    Resp 16    Ht 5' 2.5" (1.588 m)    Wt 75.3 kg (166 lb)    SpO2 98%    BMI 29.88 kg/m  If your blood pressure (BP) was elevated on multiple readings during this visit above 130 for the top number or above 80 for the bottom number, please have this repeated by your primary care provider within one month. --------------  Thank you for allowing us to participate in your care today!  It was a pleasure taking care of you.

## 2017-04-17 NOTE — Telephone Encounter (Signed)
Pt is at a friend's home and lost her balance and fell hitting her head on a granite counter top. Pt states that she did not lose consciousness and has a 4-5 inch laceration across the middle of forehead that is still bleeding after direct pressure applied. Pt advised to go to ED to seek further evaluation. Reason for Disposition . [1] Bleeding AND [2] won't stop after 10 minutes of direct pressure (using correct technique)  Answer Assessment - Initial Assessment Questions 1. MECHANISM: "How did the injury happen?" For falls, ask: "What height did you fall from?" and "What surface did you fall against?"      Lost balance 2. ONSET: "When did the injury happen?" (Minutes or hours ago)      About 15 min ago 3. NEUROLOGIC SYMPTOMS: "Was there any loss of consciousness?" "Are there any other neurological symptoms?"      No loss of consciosness 4. MENTAL STATUS: "Does the person know who he is, who you are, and where he is?"      Yes person remains alert 5. LOCATION: "What part of the head was hit?"     Front and middle of forehead 6. SCALP APPEARANCE: "What does the scalp look like? Is it bleeding now?" If so, ask: "Is it difficult to stop?"      No bleeding has not stopped 7. SIZE: For cuts, bruises, or swelling, ask: "How large is it?" (e.g., inches or centimeters)      4-5 inches 8. PAIN: "Is there any pain?" If so, ask: "How bad is it?"  (e.g., Scale 1-10; or mild, moderate, severe)     Forehead 9. TETANUS: For any breaks in the skin, ask: "When was the last tetanus booster?"     Tetanus shot 2 summers ago 10. OTHER SYMPTOMS: "Do you have any other symptoms?" (e.g., neck pain, vomiting)       No  Protocols used: HEAD INJURY-A-AH

## 2017-11-02 ENCOUNTER — Other Ambulatory Visit: Payer: Self-pay | Admitting: Family Medicine

## 2017-11-02 DIAGNOSIS — Z1231 Encounter for screening mammogram for malignant neoplasm of breast: Secondary | ICD-10-CM

## 2017-12-10 ENCOUNTER — Ambulatory Visit: Payer: Medicare Other

## 2017-12-22 ENCOUNTER — Ambulatory Visit
Admission: RE | Admit: 2017-12-22 | Discharge: 2017-12-22 | Disposition: A | Discharge status: Home / Self Care | Payer: Medicare Other | Source: Ambulatory Visit | Attending: Family Medicine | Admitting: Family Medicine

## 2017-12-22 DIAGNOSIS — Z1231 Encounter for screening mammogram for malignant neoplasm of breast: Secondary | ICD-10-CM

## 2018-11-25 ENCOUNTER — Other Ambulatory Visit: Payer: Self-pay | Admitting: Family Medicine

## 2018-11-25 DIAGNOSIS — Z1231 Encounter for screening mammogram for malignant neoplasm of breast: Secondary | ICD-10-CM

## 2019-01-05 ENCOUNTER — Other Ambulatory Visit: Payer: Self-pay

## 2019-01-05 ENCOUNTER — Ambulatory Visit
Admission: RE | Admit: 2019-01-05 | Discharge: 2019-01-05 | Disposition: A | Discharge status: Home / Self Care | Payer: Medicare Other | Source: Ambulatory Visit | Attending: Family Medicine | Admitting: Family Medicine

## 2019-01-05 DIAGNOSIS — Z1231 Encounter for screening mammogram for malignant neoplasm of breast: Secondary | ICD-10-CM

## 2019-03-15 ENCOUNTER — Other Ambulatory Visit (HOSPITAL_COMMUNITY)
Admission: RE | Admit: 2019-03-15 | Discharge: 2019-03-15 | Disposition: A | Discharge status: Home / Self Care | Payer: Medicare Other | Source: Ambulatory Visit | Attending: Family Medicine | Admitting: Family Medicine

## 2019-03-15 ENCOUNTER — Other Ambulatory Visit: Payer: Self-pay | Admitting: Family Medicine

## 2019-03-15 DIAGNOSIS — Z124 Encounter for screening for malignant neoplasm of cervix: Secondary | ICD-10-CM | POA: Diagnosis present

## 2019-03-16 ENCOUNTER — Other Ambulatory Visit: Payer: Self-pay

## 2019-03-17 LAB — CYTOLOGY - PAP
Comment: NEGATIVE
Diagnosis: NEGATIVE
High risk HPV: NEGATIVE

## 2019-03-17 LAB — CERVICOVAGINAL ANCILLARY ONLY: HPV: NOT DETECTED

## 2019-05-02 ENCOUNTER — Ambulatory Visit: Payer: Medicare Other | Attending: Internal Medicine

## 2019-05-02 DIAGNOSIS — Z20822 Contact with and (suspected) exposure to covid-19: Secondary | ICD-10-CM

## 2019-05-04 LAB — NOVEL CORONAVIRUS, NAA: SARS-CoV-2, NAA: NOT DETECTED

## 2019-05-25 ENCOUNTER — Ambulatory Visit: Payer: Medicare Other | Attending: Internal Medicine

## 2019-05-25 DIAGNOSIS — Z23 Encounter for immunization: Secondary | ICD-10-CM

## 2019-06-13 ENCOUNTER — Ambulatory Visit: Payer: Medicare Other | Attending: Internal Medicine

## 2019-06-13 DIAGNOSIS — Z23 Encounter for immunization: Secondary | ICD-10-CM

## 2019-06-13 NOTE — Progress Notes (Signed)
   Covid-19 Vaccination Clinic  Name:  Vanessa Heath    MRN: [id number] DOB: 30-Mar-1946  06/13/2019  Vanessa Heath was observed post Covid-19 immunization for 15 minutes without incidence. She was provided with Vaccine Information Sheet and instruction to access the V-Safe system.   Vanessa Heath was instructed to call 911 with any severe reactions post vaccine: Marland Kitchen Difficulty breathing  . Swelling of your face and throat  . A fast heartbeat  . A bad rash all over your body  . Dizziness and weakness    Immunizations Administered    Name Date Dose VIS Date Route   Pfizer COVID-19 Vaccine 06/13/2019  2:13 PM 0.3 mL 04/15/2019 Intramuscular   Manufacturer: [employer], [employer]   Lot: [id number]   NDC: [address]-0

## 2019-12-02 ENCOUNTER — Other Ambulatory Visit: Payer: Self-pay | Admitting: Family Medicine

## 2019-12-02 DIAGNOSIS — Z1231 Encounter for screening mammogram for malignant neoplasm of breast: Secondary | ICD-10-CM

## 2020-01-06 ENCOUNTER — Other Ambulatory Visit: Payer: Self-pay

## 2020-01-06 ENCOUNTER — Ambulatory Visit
Admission: RE | Admit: 2020-01-06 | Discharge: 2020-01-06 | Disposition: A | Discharge status: Home / Self Care | Payer: Medicare Other | Source: Ambulatory Visit | Attending: Family Medicine | Admitting: Family Medicine

## 2020-01-06 DIAGNOSIS — Z1231 Encounter for screening mammogram for malignant neoplasm of breast: Secondary | ICD-10-CM

## 2020-05-05 HISTORY — PX: EYE SURGERY: SHX253

## 2020-12-03 ENCOUNTER — Other Ambulatory Visit: Payer: Self-pay | Admitting: Family Medicine

## 2020-12-03 DIAGNOSIS — Z1231 Encounter for screening mammogram for malignant neoplasm of breast: Secondary | ICD-10-CM

## 2021-01-08 NOTE — H&P (Addendum)
TOTAL HIP ADMISSION H&P  Patient is admitted for right total hip arthroplasty.  Subjective:  Chief Complaint: Right hip pain  HPI: Vanessa Heath, 75 y.o. female, has a history of pain and functional disability in the right hip due to arthritis and patient has failed non-surgical conservative treatments for greater than 12 weeks to include corticosteriod injections and activity modification. Onset of symptoms was gradual, starting  several  years ago with gradually worsening course since that time. The patient noted no past surgery on the right hip. Patient currently rates pain in the right hip at 7 out of 10 with activity. Patient has night pain, worsening of pain with activity and weight bearing, pain that interfers with activities of daily living, and pain with passive range of motion. Patient has evidence of  bone-on-bone arthritis superolaterally with subchondral cystic formation  by imaging studies. This condition presents safety issues increasing the risk of falls. There is no current active infection.  There are no problems to display for this patient.   Past Medical History:  Diagnosis Date   Arthritis    Elevated LDH    Hypertension     Past Surgical History:  Procedure Laterality Date   REPLACEMENT TOTAL HIP W/  RESURFACING IMPLANTS     skin cancer removal      Prior to Admission medications   Medication Sig Start Date End Date Taking? Authorizing Provider  acetaminophen (TYLENOL) 500 MG tablet Take 500-1,000 mg by mouth 2 (two) times daily as needed.    [provider]  amoxicillin-clavulanate (AUGMENTIN) 875-125 MG tablet Take 1 tablet by mouth 2 (two) times daily. Patient not taking: Reported on 04/17/2017 11/05/15   Tonye Pearson, MD  cephALEXin (KEFLEX) 500 MG capsule Take 1 capsule (500 mg total) by mouth 4 (four) times daily. 04/17/17   Aviva Kluver B, PA-C  cycloSPORINE (RESTASIS) 0.05 % ophthalmic emulsion Place 1 drop into both eyes 2 (two)  times daily.    [provider]  diclofenac (VOLTAREN) 75 MG EC tablet Take 75 mg by mouth 2 (two) times daily as needed. 01/26/17   [provider]  ezetimibe (ZETIA) 10 MG tablet Take 10 mg by mouth daily. 02/15/17   [provider]  gabapentin (NEURONTIN) 300 MG capsule Take 600 mg by mouth at bedtime. 02/23/17   [provider]  levothyroxine (SYNTHROID, LEVOTHROID) 137 MCG tablet Take 137 mcg by mouth daily before breakfast.    [provider]  lisinopril (PRINIVIL,ZESTRIL) 20 MG tablet Take 20 mg by mouth daily.    [provider]  magnesium oxide (MAG-OX) 400 MG tablet Take 400 mg by mouth daily.    [provider]  Multiple Vitamins-Calcium (ONE-A-DAY WOMENS FORMULA PO) Take 1 tablet by mouth daily.    [provider]  simvastatin (ZOCOR) 20 MG tablet Take 20 mg by mouth daily. 02/15/17   [provider]    No Known Allergies  Social History   Socioeconomic History   Marital status: Married    Spouse name: Not on file   Number of children: Not on file   Years of education: Not on file   Highest education level: Not on file  Occupational History   Not on file  Tobacco Use   Smoking status: Never   Smokeless tobacco: Never  Vaping Use   Vaping Use: Never used  Substance and Sexual Activity   Alcohol use: Yes    Alcohol/week: 0.0 standard drinks    Comment: socially  Drug use: No   Sexual activity: Not on file  Other Topics Concern   Not on file  Social History Narrative   Not on file   Social Determinants of Health   Financial Resource Strain: Not on file  Food Insecurity: Not on file  Transportation Needs: Not on file  Physical Activity: Not on file  Stress: Not on file  Social Connections: Not on file  Intimate Partner Violence: Not on file    Tobacco Use: Low Risk    Smoking Tobacco Use: Never   Smokeless Tobacco Use: Never   Social History   Substance and Sexual Activity   Alcohol Use Yes   Alcohol/week: 0.0 standard drinks   Comment: socially    Family History  Problem Relation Age of Onset   Breast cancer Mother    Breast cancer Sister    Breast cancer Maternal Grandmother     Review of Systems  Constitutional:  Negative for chills and fever.  HENT:  Negative for congestion, sore throat and tinnitus.   Eyes:  Negative for double vision, photophobia and pain.  Respiratory:  Negative for cough, shortness of breath and wheezing.   Cardiovascular:  Negative for chest pain, palpitations and orthopnea.  Gastrointestinal:  Negative for heartburn, nausea and vomiting.  Genitourinary:  Negative for dysuria, frequency and urgency.  Musculoskeletal:  Positive for joint pain.  Neurological:  Negative for dizziness, weakness and headaches.    Objective:  Physical Exam: Well nourished and well developed.  General: Alert and oriented x3, cooperative and pleasant, no acute distress.  Head: normocephalic, atraumatic, neck supple.  Eyes: EOMI.  Respiratory: breath sounds clear in all fields, no wheezing, rales, or rhonchi. Cardiovascular: Regular rate and rhythm, no murmurs, gallops or rubs.  Abdomen: non-tender to palpation and soft, normoactive bowel sounds. Musculoskeletal:  Right Hip Exam:  The range of motion: Flexion to 100 degrees, Internal Rotation is minimal, External Rotation to 20 degrees, and abduction to 30 degrees with discomfort on rotational maneuvers.  There is no tenderness over the greater trochanteric bursa.  Calves soft and nontender. Motor function intact in LE. Strength 5/5 LE bilaterally. Neuro: Distal pulses 2+. Sensation to light touch intact in LE.  Imaging Review Plain radiographs demonstrate severe degenerative joint disease of the right hip. The bone quality appears to be adequate for age and reported activity level.  Assessment/Plan:  End stage arthritis, right hip  The patient history, physical examination, clinical  judgement of the provider and imaging studies are consistent with end stage degenerative joint disease of the right hip and total hip arthroplasty is deemed medically necessary. The treatment options including medical management, injection therapy, arthroscopy and arthroplasty were discussed at length. The risks and benefits of total hip arthroplasty were presented and reviewed. The risks due to aseptic loosening, infection, stiffness, dislocation/subluxation, thromboembolic complications and other imponderables were discussed. The patient acknowledged the explanation, agreed to proceed with the plan and consent was signed. Patient is being admitted for inpatient treatment for surgery, pain control, PT, OT, prophylactic antibiotics, VTE prophylaxis, progressive ambulation and ADLs and discharge planning.The patient is planning to be discharged  home with HEP .   Patient's anticipated LOS is less than 2 midnights, meeting these requirements: - Lives within 1 hour of care - Has a competent adult at home to recover with post-op recover - NO history of  - Chronic pain requiring opioids  - Diabetes  - Coronary Artery Disease  - Heart failure  - Heart attack  -  Stroke  - DVT/VTE  - Cardiac arrhythmia  - Respiratory Failure/COPD  - Renal failure  - Anemia  - Advanced Liver disease  Therapy Plans: HEP Disposition: Home with husband Planned DVT Prophylaxis: Aspirin 325 mg BID DME Needed: Dan Humphreys PCP: Merri Brunette, MD (clearance received) TXA: IV Allergies: NKDA Anesthesia Concerns: None BMI: 28.7 Last HgbA1c: Not diabetic Pharmacy: [facility]  - Patient was instructed on what medications to stop prior to surgery. - Follow-up visit in 2 weeks with Dr. Lequita Halt - Begin physical therapy following surgery - Pre-operative lab work as pre-surgical testing - Prescriptions will be provided in hospital at time of discharge  Arther Abbott, PA-C Orthopedic Surgery [facility]  [facility]

## 2021-01-08 NOTE — H&P (View-Only) (Signed)
TOTAL HIP ADMISSION H&P  Patient is admitted for right total hip arthroplasty.  Subjective:  Chief Complaint: Right hip pain  HPI: Vanessa Heath, 75 y.o. female, has a history of pain and functional disability in the right hip due to arthritis and patient has failed non-surgical conservative treatments for greater than 12 weeks to include corticosteriod injections and activity modification. Onset of symptoms was gradual, starting  several  years ago with gradually worsening course since that time. The patient noted no past surgery on the right hip. Patient currently rates pain in the right hip at 7 out of 10 with activity. Patient has night pain, worsening of pain with activity and weight bearing, pain that interfers with activities of daily living, and pain with passive range of motion. Patient has evidence of  bone-on-bone arthritis superolaterally with subchondral cystic formation  by imaging studies. This condition presents safety issues increasing the risk of falls. There is no current active infection.  There are no problems to display for this patient.   Past Medical History:  Diagnosis Date   Arthritis    Elevated LDH    Hypertension     Past Surgical History:  Procedure Laterality Date   REPLACEMENT TOTAL HIP W/  RESURFACING IMPLANTS     skin cancer removal      Prior to Admission medications   Medication Sig Start Date End Date Taking? Authorizing Provider  acetaminophen (TYLENOL) 500 MG tablet Take 500-1,000 mg by mouth 2 (two) times daily as needed.    [provider]  amoxicillin-clavulanate (AUGMENTIN) 875-125 MG tablet Take 1 tablet by mouth 2 (two) times daily. Patient not taking: Reported on 04/17/2017 11/05/15   Tonye Pearson, MD  cephALEXin (KEFLEX) 500 MG capsule Take 1 capsule (500 mg total) by mouth 4 (four) times daily. 04/17/17   Aviva Kluver B, PA-C  cycloSPORINE (RESTASIS) 0.05 % ophthalmic emulsion Place 1 drop into both eyes 2 (two)  times daily.    [provider]  diclofenac (VOLTAREN) 75 MG EC tablet Take 75 mg by mouth 2 (two) times daily as needed. 01/26/17   [provider]  ezetimibe (ZETIA) 10 MG tablet Take 10 mg by mouth daily. 02/15/17   [provider]  gabapentin (NEURONTIN) 300 MG capsule Take 600 mg by mouth at bedtime. 02/23/17   [provider]  levothyroxine (SYNTHROID, LEVOTHROID) 137 MCG tablet Take 137 mcg by mouth daily before breakfast.    [provider]  lisinopril (PRINIVIL,ZESTRIL) 20 MG tablet Take 20 mg by mouth daily.    [provider]  magnesium oxide (MAG-OX) 400 MG tablet Take 400 mg by mouth daily.    [provider]  Multiple Vitamins-Calcium (ONE-A-DAY WOMENS FORMULA PO) Take 1 tablet by mouth daily.    [provider]  simvastatin (ZOCOR) 20 MG tablet Take 20 mg by mouth daily. 02/15/17   [provider]    No Known Allergies  Social History   Socioeconomic History   Marital status: Married    Spouse name: Not on file   Number of children: Not on file   Years of education: Not on file   Highest education level: Not on file  Occupational History   Not on file  Tobacco Use   Smoking status: Never   Smokeless tobacco: Never  Vaping Use   Vaping Use: Never used  Substance and Sexual Activity   Alcohol use: Yes    Alcohol/week: 0.0 standard drinks    Comment: socially  Drug use: No   Sexual activity: Not on file  Other Topics Concern   Not on file  Social History Narrative   Not on file   Social Determinants of Health   Financial Resource Strain: Not on file  Food Insecurity: Not on file  Transportation Needs: Not on file  Physical Activity: Not on file  Stress: Not on file  Social Connections: Not on file  Intimate Partner Violence: Not on file    Tobacco Use: Low Risk    Smoking Tobacco Use: Never   Smokeless Tobacco Use: Never   Social History   Substance and Sexual Activity   Alcohol Use Yes   Alcohol/week: 0.0 standard drinks   Comment: socially    Family History  Problem Relation Age of Onset   Breast cancer Mother    Breast cancer Sister    Breast cancer Maternal Grandmother     Review of Systems  Constitutional:  Negative for chills and fever.  HENT:  Negative for congestion, sore throat and tinnitus.   Eyes:  Negative for double vision, photophobia and pain.  Respiratory:  Negative for cough, shortness of breath and wheezing.   Cardiovascular:  Negative for chest pain, palpitations and orthopnea.  Gastrointestinal:  Negative for heartburn, nausea and vomiting.  Genitourinary:  Negative for dysuria, frequency and urgency.  Musculoskeletal:  Positive for joint pain.  Neurological:  Negative for dizziness, weakness and headaches.    Objective:  Physical Exam: Well nourished and well developed.  General: Alert and oriented x3, cooperative and pleasant, no acute distress.  Head: normocephalic, atraumatic, neck supple.  Eyes: EOMI.  Respiratory: breath sounds clear in all fields, no wheezing, rales, or rhonchi. Cardiovascular: Regular rate and rhythm, no murmurs, gallops or rubs.  Abdomen: non-tender to palpation and soft, normoactive bowel sounds. Musculoskeletal:  Right Hip Exam:  The range of motion: Flexion to 100 degrees, Internal Rotation is minimal, External Rotation to 20 degrees, and abduction to 30 degrees with discomfort on rotational maneuvers.  There is no tenderness over the greater trochanteric bursa.  Calves soft and nontender. Motor function intact in LE. Strength 5/5 LE bilaterally. Neuro: Distal pulses 2+. Sensation to light touch intact in LE.  Imaging Review Plain radiographs demonstrate severe degenerative joint disease of the right hip. The bone quality appears to be adequate for age and reported activity level.  Assessment/Plan:  End stage arthritis, right hip  The patient history, physical examination, clinical  judgement of the provider and imaging studies are consistent with end stage degenerative joint disease of the right hip and total hip arthroplasty is deemed medically necessary. The treatment options including medical management, injection therapy, arthroscopy and arthroplasty were discussed at length. The risks and benefits of total hip arthroplasty were presented and reviewed. The risks due to aseptic loosening, infection, stiffness, dislocation/subluxation, thromboembolic complications and other imponderables were discussed. The patient acknowledged the explanation, agreed to proceed with the plan and consent was signed. Patient is being admitted for inpatient treatment for surgery, pain control, PT, OT, prophylactic antibiotics, VTE prophylaxis, progressive ambulation and ADLs and discharge planning.The patient is planning to be discharged  home with HEP .   Patient's anticipated LOS is less than 2 midnights, meeting these requirements: - Lives within 1 hour of care - Has a competent adult at home to recover with post-op recover - NO history of  - Chronic pain requiring opioids  - Diabetes  - Coronary Artery Disease  - Heart failure  - Heart attack  -  Stroke  - DVT/VTE  - Cardiac arrhythmia  - Respiratory Failure/COPD  - Renal failure  - Anemia  - Advanced Liver disease  Therapy Plans: HEP Disposition: Home with husband Planned DVT Prophylaxis: Aspirin 325 mg BID DME Needed: Dan Humphreys PCP: Merri Brunette, MD (clearance received) TXA: IV Allergies: NKDA Anesthesia Concerns: None BMI: 28.7 Last HgbA1c: Not diabetic Pharmacy: [facility]  - Patient was instructed on what medications to stop prior to surgery. - Follow-up visit in 2 weeks with Dr. Lequita Halt - Begin physical therapy following surgery - Pre-operative lab work as pre-surgical testing - Prescriptions will be provided in hospital at time of discharge  Arther Abbott, PA-C Orthopedic Surgery [facility]

## 2021-01-22 ENCOUNTER — Other Ambulatory Visit: Payer: Self-pay

## 2021-01-22 ENCOUNTER — Ambulatory Visit
Admission: RE | Admit: 2021-01-22 | Discharge: 2021-01-22 | Disposition: A | Discharge status: Home / Self Care | Payer: Medicare Other | Source: Ambulatory Visit | Attending: Family Medicine | Admitting: Family Medicine

## 2021-01-22 DIAGNOSIS — Z1231 Encounter for screening mammogram for malignant neoplasm of breast: Secondary | ICD-10-CM

## 2021-01-24 NOTE — Patient Instructions (Signed)
DUE TO COVID-19 ONLY ONE VISITOR IS ALLOWED TO COME WITH YOU AND STAY IN THE WAITING ROOM ONLY DURING PRE OP AND PROCEDURE DAY OF SURGERY IF YOU ARE GOING HOME AFTER SURGERY. IF YOU ARE SPENDING THE NIGHT 2 PEOPLE MAY VISIT WITH YOU IN YOUR PRIVATE ROOM AFTER SURGERY UNTIL VISITING  HOURS ARE OVER AT 8:00 PM AND 1 VISITORS CAN SPEND THE NIGHT.   YOU NEED TO HAVE A COVID 19 TEST ON_9/29____THIS TEST MUST BE DONE BEFORE SURGERY,  COVID TESTING SITE  IS LOCATED AT [address], [place]. REMAIN IN YOUR CAR THIS IS A DRIVE UP TEST. AFTER YOUR COVID TEST PLEASE WEAR A MASK OUT IN PUBLIC AND SOCIAL DISTANCE AND WASH YOUR HANDS FREQUENTLY, ALSO ASK ALL YOUR CLOSE CONTACT PERSONS TO WEAR A MASK AND SOCIAL DISTANCE AND WASH THEIR HANDS FREQUENTLY ALSO.               Vanessa Heath     Your procedure is scheduled on: 02/04/21   Report to [facility] Main  Entrance   Report to admitting at 11:00 AM     Call this number if you have problems the morning of surgery [phone]    No food after midnight.    You may have clear liquid until 10:30 AM.    At 10:00 AM drink pre surgery drink.   Nothing by mouth after 10:30 AM. .    CLEAR LIQUID DIET   Foods Allowed                                                                     Foods Excluded                                                                                                   liquids that you cannot  Plain Jell-O any favor except red or purple                                           see through such as: Fruit ices (not with fruit pulp)                                     milk, soups, orange juice  Iced Popsicles                                    All solid food Carbonated beverages, regular and diet  Cranberry, grape and apple juices Sports drinks like Gatorade Lightly seasoned clear broth or consume(fat free) Sugar    BRUSH YOUR TEETH MORNING OF SURGERY AND RINSE  YOUR MOUTH OUT, NO CHEWING GUM CANDY OR MINTS.     Take these medicines the morning of surgery with A SIP OF WATER: Wellbutrin, Amlodipine,Levothyroxine                                You may not have any metal on your body including hair pins and              piercings  Do not wear jewelry, make-up, lotions, powders or perfumes, deodorant             Do not wear nail polish on your fingernails.  Do not shave  48 hours prior to surgery.            Do not bring valuables to the hospital. [facility] IS NOT             RESPONSIBLE   FOR VALUABLES.  Contacts, dentures or bridgework may not be worn into surgery.  Leave suitcase in the car. After surgery it may be brought to your room.                Please read over the following fact sheets you were given: _____________________________________________________________________             [facility] - Preparing for Surgery Before surgery, you can play an important role.  Because skin is not sterile, your skin needs to be as free of germs as possible.  You can reduce the number of germs on your skin by washing with CHG (chlorahexidine gluconate) soap before surgery.  CHG is an antiseptic cleaner which kills germs and bonds with the skin to continue killing germs even after washing. Please DO NOT use if you have an allergy to CHG or antibacterial soaps.  If your skin becomes reddened/irritated stop using the CHG and inform your nurse when you arrive at Short Stay. Do not shave (including legs and underarms) for at least 48 hours prior to the first CHG shower.  You may shave your face/neck. Please follow these instructions carefully:  1.  Shower with CHG Soap the night before surgery and the  morning of Surgery.  2.  If you choose to wash your hair, wash your hair first as usual with your  normal  shampoo.  3.  After you shampoo, rinse your hair and body thoroughly to remove the  shampoo.                           4.  Use CHG as you would any  other liquid soap.  You can apply chg directly  to the skin and wash                       Gently with a scrungie or clean washcloth.  5.  Apply the CHG Soap to your body ONLY FROM THE NECK DOWN.   Do not use on face/ open                           Wound or open sores. Avoid contact with eyes, ears mouth and genitals (private parts).  Wash face,  Genitals (private parts) with your normal soap.             6.  Wash thoroughly, paying special attention to the area where your surgery  will be performed.  7.  Thoroughly rinse your body with warm water from the neck down.  8.  DO NOT shower/wash with your normal soap after using and rinsing off  the CHG Soap.                9.  Pat yourself dry with a clean towel.            10.  Wear clean pajamas.            11.  Place clean sheets on your bed the night of your first shower and do not  sleep with pets. Day of Surgery : Do not apply any lotions/deodorants the morning of surgery.  Please wear clean clothes to the hospital/surgery center.  FAILURE TO FOLLOW THESE INSTRUCTIONS MAY RESULT IN THE CANCELLATION OF YOUR SURGERY PATIENT SIGNATURE_________________________________  NURSE SIGNATURE__________________________________  ________________________________________________________________________   Vanessa Heath  An incentive spirometer is a tool that can help keep your lungs clear and active. This tool measures how well you are filling your lungs with each breath. Taking long deep breaths may help reverse or decrease the chance of developing breathing (pulmonary) problems (especially infection) following: A long period of time when you are unable to move or be active. BEFORE THE PROCEDURE  If the spirometer includes an indicator to show your best effort, your nurse or respiratory therapist will set it to a desired goal. If possible, sit up straight or lean slightly forward. Try not to slouch. Hold the incentive  spirometer in an upright position. INSTRUCTIONS FOR USE  Sit on the edge of your bed if possible, or sit up as far as you can in bed or on a chair. Hold the incentive spirometer in an upright position. Breathe out normally. Place the mouthpiece in your mouth and seal your lips tightly around it. Breathe in slowly and as deeply as possible, raising the piston or the ball toward the top of the column. Hold your breath for 3-5 seconds or for as long as possible. Allow the piston or ball to fall to the bottom of the column. Remove the mouthpiece from your mouth and breathe out normally. Rest for a few seconds and repeat Steps 1 through 7 at least 10 times every 1-2 hours when you are awake. Take your time and take a few normal breaths between deep breaths. The spirometer may include an indicator to show your best effort. Use the indicator as a goal to work toward during each repetition. After each set of 10 deep breaths, practice coughing to be sure your lungs are clear. If you have an incision (the cut made at the time of surgery), support your incision when coughing by placing a pillow or rolled up towels firmly against it. Once you are able to get out of bed, walk around indoors and cough well. You may stop using the incentive spirometer when instructed by your caregiver.  RISKS AND COMPLICATIONS Take your time so you do not get dizzy or light-headed. If you are in pain, you may need to take or ask for pain medication before doing incentive spirometry. It is harder to take a deep breath if you are having pain. AFTER USE Rest and breathe slowly and easily. It can be helpful to keep track of  a log of your progress. Your caregiver can provide you with a simple table to help with this. If you are using the spirometer at home, follow these instructions: SEEK MEDICAL CARE IF:  You are having difficultly using the spirometer. You have trouble using the spirometer as often as instructed. Your pain  medication is not giving enough relief while using the spirometer. You develop fever of 100.5 F (38.1 C) or higher. SEEK IMMEDIATE MEDICAL CARE IF:  You cough up bloody sputum that had not been present before. You develop fever of 102 F (38.9 C) or greater. You develop worsening pain at or near the incision site. MAKE SURE YOU:  Understand these instructions. Will watch your condition. Will get help right away if you are not doing well or get worse. Document Released: 09/01/2006 Document Revised: 07/14/2011 Document Reviewed: 11/02/2006 [facility] Patient Information 2014 [place], [place].   ________________________________________________________________________

## 2021-01-25 ENCOUNTER — Encounter (HOSPITAL_COMMUNITY): Payer: Self-pay

## 2021-01-25 ENCOUNTER — Other Ambulatory Visit: Payer: Self-pay

## 2021-01-25 ENCOUNTER — Encounter (HOSPITAL_COMMUNITY)
Admission: RE | Admit: 2021-01-25 | Discharge: 2021-01-25 | Disposition: A | Discharge status: Home / Self Care | Payer: Medicare Other | Source: Ambulatory Visit | Attending: Orthopedic Surgery | Admitting: Orthopedic Surgery

## 2021-01-25 DIAGNOSIS — Z01818 Encounter for other preprocedural examination: Secondary | ICD-10-CM | POA: Diagnosis present

## 2021-01-25 HISTORY — DX: Hypothyroidism, unspecified: E03.9

## 2021-01-25 LAB — COMPREHENSIVE METABOLIC PANEL
ALT: 22 U/L (ref 0–44)
AST: 26 U/L (ref 15–41)
Albumin: 4.2 g/dL (ref 3.5–5.0)
Alkaline Phosphatase: 58 U/L (ref 38–126)
Anion gap: 7 (ref 5–15)
BUN: 8 mg/dL (ref 8–23)
CO2: 24 mmol/L (ref 22–32)
Calcium: 9.4 mg/dL (ref 8.9–10.3)
Chloride: 101 mmol/L (ref 98–111)
Creatinine, Ser: 0.56 mg/dL (ref 0.44–1.00)
GFR, Estimated: >60 mL/min (ref >60)
Glucose, Bld: 110 mg/dL — ABNORMAL HIGH (ref 70–99)
Potassium: 4.7 mmol/L (ref 3.5–5.1)
Sodium: 132 mmol/L — ABNORMAL LOW (ref 135–145)
Total Bilirubin: 0.7 mg/dL (ref 0.3–1.2)
Total Protein: 6.9 g/dL (ref 6.5–8.1)

## 2021-01-25 LAB — CBC
HCT: 40.5 % (ref 36.0–46.0)
Hemoglobin: 13.4 g/dL (ref 12.0–15.0)
MCH: 32.4 pg (ref 26.0–34.0)
MCHC: 33.1 g/dL (ref 30.0–36.0)
MCV: 98.1 fL (ref 80.0–100.0)
Platelets: 248 10*3/uL (ref 150–400)
RBC: 4.13 MIL/uL (ref 3.87–5.11)
RDW: 12.4 % (ref 11.5–15.5)
WBC: 6.6 10*3/uL (ref 4.0–10.5)
nRBC: 0 % (ref 0.0–0.2)

## 2021-01-25 LAB — PROTIME-INR
INR: 1 (ref 0.8–1.2)
Prothrombin Time: 13.1 seconds (ref 11.4–15.2)

## 2021-01-25 LAB — SURGICAL PCR SCREEN
MRSA, PCR: NEGATIVE
Staphylococcus aureus: POSITIVE — AB

## 2021-01-25 NOTE — Progress Notes (Signed)
COVID test 01/31/21   PCP - Dr. Erby Pian  LOV 01/23/21 Cardiologist - none  Chest x-ray - no EKG - 01/23/21-chart Stress Test - no ECHO - no Cardiac Cath - no Pacemaker/ICD device last checked:NA  Sleep Study - no CPAP -   Fasting Blood Sugar - NA Checks Blood Sugar _____ times a day  Blood Thinner Instructions:NA Aspirin Instructions: Last Dose:  Anesthesia review: yes  Patient denies shortness of breath, fever, cough and chest pain at PAT appointment  Pt has no SOB with any activities. Her BP was up at the PAT visit. 168/108 She will monitor it at home and call Dr. Katrinka Blazing.  Patient verbalized understanding of instructions that were given to them at the PAT appointment. Patient was also instructed that they will need to review over the PAT instructions again at home before surgery. yes

## 2021-01-29 LAB — BPAM RBC
Blood Product Expiration Date: 202210292359
Blood Product Expiration Date: 202210292359
Unit Type and Rh: 6200
Unit Type and Rh: 6200

## 2021-01-29 LAB — TYPE AND SCREEN
ABO/RH(D): A POS
Antibody Screen: POSITIVE
PT AG Type: NEGATIVE
Unit division: 0
Unit division: 0

## 2021-01-31 ENCOUNTER — Other Ambulatory Visit: Payer: Self-pay | Admitting: Orthopedic Surgery

## 2021-01-31 LAB — SARS CORONAVIRUS 2 (TAT 6-24 HRS): SARS Coronavirus 2: NEGATIVE

## 2021-02-03 ENCOUNTER — Encounter (HOSPITAL_COMMUNITY): Payer: Self-pay | Admitting: Orthopedic Surgery

## 2021-02-04 ENCOUNTER — Ambulatory Visit (HOSPITAL_COMMUNITY)
Admission: RE | Admit: 2021-02-04 | Discharge: 2021-02-04 | Disposition: A | Discharge status: Home / Self Care | Payer: Medicare Other | Attending: Orthopedic Surgery | Admitting: Orthopedic Surgery

## 2021-02-04 ENCOUNTER — Encounter (HOSPITAL_COMMUNITY): Payer: Self-pay | Admitting: Orthopedic Surgery

## 2021-02-04 ENCOUNTER — Encounter (HOSPITAL_COMMUNITY)
Admission: RE | Disposition: A | Discharge status: Home / Self Care | Payer: Self-pay | Source: Home / Self Care | Attending: Orthopedic Surgery

## 2021-02-04 DIAGNOSIS — I10 Essential (primary) hypertension: Secondary | ICD-10-CM | POA: Insufficient documentation

## 2021-02-04 DIAGNOSIS — Z79890 Hormone replacement therapy (postmenopausal): Secondary | ICD-10-CM | POA: Insufficient documentation

## 2021-02-04 DIAGNOSIS — I110 Hypertensive heart disease with heart failure: Secondary | ICD-10-CM | POA: Diagnosis not present

## 2021-02-04 DIAGNOSIS — Z79899 Other long term (current) drug therapy: Secondary | ICD-10-CM | POA: Diagnosis not present

## 2021-02-04 DIAGNOSIS — Z791 Long term (current) use of non-steroidal anti-inflammatories (NSAID): Secondary | ICD-10-CM | POA: Insufficient documentation

## 2021-02-04 DIAGNOSIS — E039 Hypothyroidism, unspecified: Secondary | ICD-10-CM | POA: Insufficient documentation

## 2021-02-04 DIAGNOSIS — M1611 Unilateral primary osteoarthritis, right hip: Secondary | ICD-10-CM | POA: Diagnosis not present

## 2021-02-04 SURGERY — ARTHROPLASTY, HIP, TOTAL, ANTERIOR APPROACH
Anesthesia: Spinal | Site: Hip | Laterality: Right

## 2021-02-04 MED ORDER — CHLORHEXIDINE GLUCONATE 0.12 % MT SOLN
15.0000 mL | Freq: Once | OROMUCOSAL | Status: AC
  Administered 2021-02-04: 15 mL via OROMUCOSAL

## 2021-02-04 MED ORDER — DEXAMETHASONE SODIUM PHOSPHATE 10 MG/ML IJ SOLN: 8.0000 mg | Freq: Once | INTRAMUSCULAR | Status: DC

## 2021-02-04 MED ORDER — ORAL CARE MOUTH RINSE: 15.0000 mL | Freq: Once | OROMUCOSAL | Status: AC

## 2021-02-04 MED ORDER — ACETAMINOPHEN 10 MG/ML IV SOLN
1000.0000 mg | Freq: Once | INTRAVENOUS | Status: DC
Start: 2021-02-04 — End: 2021-02-06
  Administered 2021-02-06: 1000 mg via INTRAVENOUS
  Filled 2021-02-04: qty 100

## 2021-02-04 MED ORDER — LIDOCAINE HCL (PF) 2 % IJ SOLN
INTRAMUSCULAR | Status: AC
  Filled 2021-02-04: qty 5

## 2021-02-04 MED ORDER — TRANEXAMIC ACID-NACL 1000-0.7 MG/100ML-% IV SOLN
1000.0000 mg | INTRAVENOUS | Status: DC
  Administered 2021-02-06: 1000 mg via INTRAVENOUS
  Filled 2021-02-04: qty 100

## 2021-02-04 MED ORDER — PROPOFOL 1000 MG/100ML IV EMUL
INTRAVENOUS | Status: AC
  Filled 2021-02-04: qty 100

## 2021-02-04 MED ORDER — LACTATED RINGERS IV SOLN: INTRAVENOUS | Status: DC

## 2021-02-04 MED ORDER — POVIDONE-IODINE 10 % EX SWAB
2.0000 "application " | Freq: Once | CUTANEOUS | Status: AC
  Administered 2021-02-04: 2 via TOPICAL

## 2021-02-04 MED ORDER — CEFAZOLIN SODIUM-DEXTROSE 2-4 GM/100ML-% IV SOLN
2.0000 g | INTRAVENOUS | Status: DC
  Administered 2021-02-06: 2 g via INTRAVENOUS
  Filled 2021-02-04: qty 100

## 2021-02-04 MED ORDER — DEXMEDETOMIDINE (PRECEDEX) IN NS 20 MCG/5ML (4 MCG/ML) IV SYRINGE
PREFILLED_SYRINGE | INTRAVENOUS | Status: AC
  Filled 2021-02-04: qty 5

## 2021-02-04 NOTE — Anesthesia Preprocedure Evaluation (Addendum)
Anesthesia Evaluation  Patient identified by MRN, date of birth, ID band Patient awake    Reviewed: Allergy & Precautions, NPO status , Patient's Chart, lab work & pertinent test results  Airway Mallampati: II  TM Distance: >3 FB Neck ROM: Full    Dental no notable dental hx.    Pulmonary neg pulmonary ROS,    Pulmonary exam normal breath sounds clear to auscultation       Cardiovascular hypertension, Pt. on medications Normal cardiovascular exam Rhythm:Regular Rate:Normal     Neuro/Psych negative neurological ROS  negative psych ROS   GI/Hepatic negative GI ROS, Neg liver ROS,   Endo/Other  Hypothyroidism   Renal/GU negative Renal ROS  negative genitourinary   Musculoskeletal  (+) Arthritis , Osteoarthritis,    Abdominal   Peds negative pediatric ROS (+)  Hematology negative hematology ROS (+)   Anesthesia Other Findings   Reproductive/Obstetrics negative OB ROS                             Anesthesia Physical Anesthesia Plan  ASA: 2  Anesthesia Plan: Spinal   Post-op Pain Management:    Induction: Intravenous  PONV Risk Score and Plan: 2 and Ondansetron, Treatment may vary due to age or medical condition and Midazolam  Airway Management Planned: Simple Face Mask  Additional Equipment:   Intra-op Plan:   Post-operative Plan:   Informed Consent: I have reviewed the patients History and Physical, chart, labs and discussed the procedure including the risks, benefits and alternatives for the proposed anesthesia with the patient or authorized representative who has indicated his/her understanding and acceptance.     Dental advisory given  Plan Discussed with: CRNA and Surgeon  Anesthesia Plan Comments:        Anesthesia Quick Evaluation

## 2021-02-06 ENCOUNTER — Ambulatory Visit (HOSPITAL_COMMUNITY): Payer: Medicare Other | Admitting: Anesthesiology

## 2021-02-06 ENCOUNTER — Observation Stay (HOSPITAL_COMMUNITY)
Admission: RE | Admit: 2021-02-06 | Discharge: 2021-02-07 | Disposition: A | Discharge status: Home / Self Care | Payer: Medicare Other | Attending: Orthopedic Surgery | Admitting: Orthopedic Surgery

## 2021-02-06 ENCOUNTER — Encounter (HOSPITAL_COMMUNITY): Payer: Self-pay | Admitting: Orthopedic Surgery

## 2021-02-06 ENCOUNTER — Observation Stay (HOSPITAL_COMMUNITY): Payer: Medicare Other

## 2021-02-06 ENCOUNTER — Ambulatory Visit (HOSPITAL_COMMUNITY): Payer: Medicare Other

## 2021-02-06 ENCOUNTER — Encounter (HOSPITAL_COMMUNITY)
Admission: RE | Disposition: A | Discharge status: Home / Self Care | Payer: Self-pay | Source: Home / Self Care | Attending: Orthopedic Surgery

## 2021-02-06 DIAGNOSIS — Z79899 Other long term (current) drug therapy: Secondary | ICD-10-CM | POA: Diagnosis not present

## 2021-02-06 DIAGNOSIS — I10 Essential (primary) hypertension: Secondary | ICD-10-CM | POA: Insufficient documentation

## 2021-02-06 DIAGNOSIS — Z96641 Presence of right artificial hip joint: Secondary | ICD-10-CM | POA: Diagnosis present

## 2021-02-06 DIAGNOSIS — M1611 Unilateral primary osteoarthritis, right hip: Secondary | ICD-10-CM | POA: Diagnosis not present

## 2021-02-06 DIAGNOSIS — E039 Hypothyroidism, unspecified: Secondary | ICD-10-CM | POA: Insufficient documentation

## 2021-02-06 DIAGNOSIS — M169 Osteoarthritis of hip, unspecified: Secondary | ICD-10-CM | POA: Diagnosis present

## 2021-02-06 DIAGNOSIS — Z96649 Presence of unspecified artificial hip joint: Secondary | ICD-10-CM

## 2021-02-06 DIAGNOSIS — Z419 Encounter for procedure for purposes other than remedying health state, unspecified: Secondary | ICD-10-CM

## 2021-02-06 HISTORY — PX: TOTAL HIP ARTHROPLASTY: SHX124

## 2021-02-06 LAB — TYPE AND SCREEN
ABO/RH(D): A POS
Antibody Screen: POSITIVE

## 2021-02-06 SURGERY — ARTHROPLASTY, HIP, TOTAL, ANTERIOR APPROACH
Anesthesia: Spinal | Site: Hip

## 2021-02-06 MED ORDER — HYDROMORPHONE HCL 1 MG/ML IJ SOLN: 0.2500 mg | INTRAMUSCULAR | Status: DC | PRN

## 2021-02-06 MED ORDER — SODIUM CHLORIDE 0.9 % IV SOLN: INTRAVENOUS | Status: DC

## 2021-02-06 MED ORDER — METHOCARBAMOL 500 MG IVPB - SIMPLE MED
INTRAVENOUS | Status: AC
  Administered 2021-02-06: 500 mg via INTRAVENOUS
  Filled 2021-02-06: qty 50

## 2021-02-06 MED ORDER — OXYCODONE HCL 5 MG PO TABS: 5.0000 mg | ORAL_TABLET | Freq: Once | ORAL | Status: DC | PRN

## 2021-02-06 MED ORDER — ACETAMINOPHEN 10 MG/ML IV SOLN
INTRAVENOUS | Status: AC
  Filled 2021-02-06: qty 100

## 2021-02-06 MED ORDER — TRANEXAMIC ACID-NACL 1000-0.7 MG/100ML-% IV SOLN
INTRAVENOUS | Status: AC
  Filled 2021-02-06: qty 100

## 2021-02-06 MED ORDER — AMLODIPINE BESYLATE 5 MG PO TABS: 2.5000 mg | ORAL_TABLET | Freq: Every day | ORAL | Status: DC

## 2021-02-06 MED ORDER — ONDANSETRON HCL 4 MG/2ML IJ SOLN
INTRAMUSCULAR | Status: AC
  Filled 2021-02-06: qty 2

## 2021-02-06 MED ORDER — CEFAZOLIN SODIUM-DEXTROSE 2-4 GM/100ML-% IV SOLN
INTRAVENOUS | Status: AC
  Filled 2021-02-06: qty 100

## 2021-02-06 MED ORDER — DEXAMETHASONE SODIUM PHOSPHATE 10 MG/ML IJ SOLN
INTRAMUSCULAR | Status: AC
  Filled 2021-02-06: qty 1

## 2021-02-06 MED ORDER — PHENOL 1.4 % MT LIQD: 1.0000 | OROMUCOSAL | Status: DC | PRN

## 2021-02-06 MED ORDER — HYDROCODONE-ACETAMINOPHEN 5-325 MG PO TABS
ORAL_TABLET | ORAL | Status: AC
  Filled 2021-02-06: qty 1

## 2021-02-06 MED ORDER — BUPROPION HCL ER (XL) 150 MG PO TB24: 150.0000 mg | ORAL_TABLET | Freq: Every morning | ORAL | Status: DC

## 2021-02-06 MED ORDER — TRAMADOL HCL 50 MG PO TABS
50.0000 mg | ORAL_TABLET | Freq: Four times a day (QID) | ORAL | Status: DC | PRN
  Administered 2021-02-06: 50 mg via ORAL

## 2021-02-06 MED ORDER — EPHEDRINE SULFATE-NACL 50-0.9 MG/10ML-% IV SOSY
PREFILLED_SYRINGE | INTRAVENOUS | Status: DC | PRN
  Administered 2021-02-06 (×3): 5 mg via INTRAVENOUS

## 2021-02-06 MED ORDER — DEXAMETHASONE SODIUM PHOSPHATE 10 MG/ML IJ SOLN: 10.0000 mg | Freq: Once | INTRAMUSCULAR | Status: DC

## 2021-02-06 MED ORDER — HYDROMORPHONE HCL 1 MG/ML IJ SOLN
INTRAMUSCULAR | Status: AC
  Filled 2021-02-06: qty 1

## 2021-02-06 MED ORDER — BUPIVACAINE-EPINEPHRINE (PF) 0.25% -1:200000 IJ SOLN
INTRAMUSCULAR | Status: DC | PRN
  Administered 2021-02-06: 30 mL

## 2021-02-06 MED ORDER — ONDANSETRON HCL 4 MG PO TABS: 4.0000 mg | ORAL_TABLET | Freq: Four times a day (QID) | ORAL | Status: DC | PRN

## 2021-02-06 MED ORDER — HYDROCODONE-ACETAMINOPHEN 5-325 MG PO TABS
1.0000 | ORAL_TABLET | ORAL | Status: DC | PRN
  Administered 2021-02-06 (×2): 2 via ORAL
  Administered 2021-02-07: 1 via ORAL
  Filled 2021-02-06: qty 2
  Filled 2021-02-06: qty 1
  Filled 2021-02-06: qty 2

## 2021-02-06 MED ORDER — LACTATED RINGERS IV SOLN: INTRAVENOUS | Status: DC

## 2021-02-06 MED ORDER — DEXAMETHASONE SODIUM PHOSPHATE 10 MG/ML IJ SOLN
INTRAMUSCULAR | Status: DC | PRN
  Administered 2021-02-06: 8 mg via INTRAVENOUS

## 2021-02-06 MED ORDER — ACETAMINOPHEN 325 MG PO TABS: 325.0000 mg | ORAL_TABLET | Freq: Four times a day (QID) | ORAL | Status: DC | PRN

## 2021-02-06 MED ORDER — METOCLOPRAMIDE HCL 5 MG PO TABS: 5.0000 mg | ORAL_TABLET | Freq: Three times a day (TID) | ORAL | Status: DC | PRN

## 2021-02-06 MED ORDER — MIDAZOLAM HCL 2 MG/2ML IJ SOLN
INTRAMUSCULAR | Status: AC
  Filled 2021-02-06: qty 2

## 2021-02-06 MED ORDER — METHOCARBAMOL 500 MG IVPB - SIMPLE MED
500.0000 mg | Freq: Four times a day (QID) | INTRAVENOUS | Status: DC | PRN
  Filled 2021-02-06: qty 50

## 2021-02-06 MED ORDER — METHOCARBAMOL 500 MG PO TABS: 500.0000 mg | ORAL_TABLET | Freq: Four times a day (QID) | ORAL | Status: DC | PRN

## 2021-02-06 MED ORDER — POLYETHYLENE GLYCOL 3350 17 G PO PACK: 17.0000 g | PACK | Freq: Every day | ORAL | Status: DC | PRN

## 2021-02-06 MED ORDER — BISACODYL 10 MG RE SUPP: 10.0000 mg | Freq: Every day | RECTAL | Status: DC | PRN

## 2021-02-06 MED ORDER — MORPHINE SULFATE (PF) 2 MG/ML IV SOLN: 0.5000 mg | INTRAVENOUS | Status: DC | PRN

## 2021-02-06 MED ORDER — BUPIVACAINE-EPINEPHRINE (PF) 0.25% -1:200000 IJ SOLN
INTRAMUSCULAR | Status: AC
  Filled 2021-02-06: qty 30

## 2021-02-06 MED ORDER — BUPIVACAINE IN DEXTROSE 0.75-8.25 % IT SOLN
INTRATHECAL | Status: DC | PRN
  Administered 2021-02-06: 1.6 mL via INTRATHECAL

## 2021-02-06 MED ORDER — WATER FOR IRRIGATION, STERILE IR SOLN
Status: DC | PRN
  Administered 2021-02-06: 2000 mL

## 2021-02-06 MED ORDER — 0.9 % SODIUM CHLORIDE (POUR BTL) OPTIME
TOPICAL | Status: DC | PRN
  Administered 2021-02-06: 1000 mL

## 2021-02-06 MED ORDER — ACETAMINOPHEN 10 MG/ML IV SOLN: 1000.0000 mg | Freq: Once | INTRAVENOUS | Status: DC | PRN

## 2021-02-06 MED ORDER — PROMETHAZINE HCL 25 MG/ML IJ SOLN: 6.2500 mg | INTRAMUSCULAR | Status: DC | PRN

## 2021-02-06 MED ORDER — GABAPENTIN 300 MG PO CAPS
600.0000 mg | ORAL_CAPSULE | Freq: Every day | ORAL | Status: DC
  Administered 2021-02-06: 600 mg via ORAL
  Filled 2021-02-06: qty 2

## 2021-02-06 MED ORDER — MENTHOL 3 MG MT LOZG: 1.0000 | LOZENGE | OROMUCOSAL | Status: DC | PRN

## 2021-02-06 MED ORDER — FENTANYL CITRATE (PF) 100 MCG/2ML IJ SOLN
INTRAMUSCULAR | Status: DC | PRN
  Administered 2021-02-06 (×2): 25 ug via INTRAVENOUS

## 2021-02-06 MED ORDER — HYDROMORPHONE HCL 1 MG/ML IJ SOLN
0.2500 mg | INTRAMUSCULAR | Status: DC | PRN
Start: 2021-02-06 — End: 2021-02-06
  Administered 2021-02-06: 0.25 mg via INTRAVENOUS

## 2021-02-06 MED ORDER — ASPIRIN EC 325 MG PO TBEC
325.0000 mg | DELAYED_RELEASE_TABLET | Freq: Two times a day (BID) | ORAL | Status: DC
  Administered 2021-02-07: 325 mg via ORAL
  Filled 2021-02-06: qty 1

## 2021-02-06 MED ORDER — PROPOFOL 500 MG/50ML IV EMUL
INTRAVENOUS | Status: AC
  Filled 2021-02-06: qty 50

## 2021-02-06 MED ORDER — ONDANSETRON HCL 4 MG/2ML IJ SOLN: 4.0000 mg | Freq: Four times a day (QID) | INTRAMUSCULAR | Status: DC | PRN

## 2021-02-06 MED ORDER — OXYCODONE HCL 5 MG/5ML PO SOLN: 5.0000 mg | Freq: Once | ORAL | Status: DC | PRN

## 2021-02-06 MED ORDER — EZETIMIBE 10 MG PO TABS: 10.0000 mg | ORAL_TABLET | Freq: Every day | ORAL | Status: DC

## 2021-02-06 MED ORDER — METOCLOPRAMIDE HCL 5 MG/ML IJ SOLN: 5.0000 mg | Freq: Three times a day (TID) | INTRAMUSCULAR | Status: DC | PRN

## 2021-02-06 MED ORDER — PROPOFOL 500 MG/50ML IV EMUL
INTRAVENOUS | Status: DC | PRN
  Administered 2021-02-06: 75 ug/kg/min via INTRAVENOUS

## 2021-02-06 MED ORDER — SIMVASTATIN 20 MG PO TABS: 20.0000 mg | ORAL_TABLET | Freq: Every day | ORAL | Status: DC

## 2021-02-06 MED ORDER — DOCUSATE SODIUM 100 MG PO CAPS
100.0000 mg | ORAL_CAPSULE | Freq: Two times a day (BID) | ORAL | Status: DC
  Administered 2021-02-06 – 2021-02-07 (×2): 100 mg via ORAL
  Filled 2021-02-06 (×2): qty 1

## 2021-02-06 MED ORDER — CEFAZOLIN SODIUM-DEXTROSE 2-4 GM/100ML-% IV SOLN
2.0000 g | Freq: Four times a day (QID) | INTRAVENOUS | Status: AC
  Administered 2021-02-06 (×2): 2 g via INTRAVENOUS
  Filled 2021-02-06 (×2): qty 100

## 2021-02-06 MED ORDER — ONDANSETRON HCL 4 MG/2ML IJ SOLN
INTRAMUSCULAR | Status: DC | PRN
  Administered 2021-02-06: 4 mg via INTRAVENOUS

## 2021-02-06 MED ORDER — LEVOTHYROXINE SODIUM 125 MCG PO TABS
125.0000 ug | ORAL_TABLET | Freq: Every day | ORAL | Status: DC
  Administered 2021-02-07: 125 ug via ORAL
  Filled 2021-02-06: qty 1

## 2021-02-06 MED ORDER — TRAMADOL HCL 50 MG PO TABS
ORAL_TABLET | ORAL | Status: AC
  Filled 2021-02-06: qty 1

## 2021-02-06 MED ORDER — OXYCODONE HCL 5 MG/5ML PO SOLN
5.0000 mg | Freq: Once | ORAL | Status: DC | PRN
Start: 2021-02-06 — End: 2021-02-06

## 2021-02-06 MED ORDER — FENTANYL CITRATE (PF) 100 MCG/2ML IJ SOLN
INTRAMUSCULAR | Status: AC
  Filled 2021-02-06: qty 2

## 2021-02-06 MED ORDER — ONDANSETRON HCL 4 MG/2ML IJ SOLN: 4.0000 mg | Freq: Once | INTRAMUSCULAR | Status: DC | PRN

## 2021-02-06 MED ORDER — MIDAZOLAM HCL 5 MG/5ML IJ SOLN
INTRAMUSCULAR | Status: DC | PRN
  Administered 2021-02-06: 1 mg via INTRAVENOUS

## 2021-02-06 SURGICAL SUPPLY — 46 items
BAG COUNTER SPONGE SURGICOUNT (BAG) IMPLANT
BAG DECANTER FOR FLEXI CONT (MISCELLANEOUS) IMPLANT
BAG SPEC THK2 15X12 ZIP CLS (MISCELLANEOUS)
BAG SPNG CNTER NS LX DISP (BAG)
BAG ZIPLOCK 12X15 (MISCELLANEOUS) IMPLANT
BALL HIP CERAMIC 32MM PLUS 9 IMPLANT
BLADE SAG 18X100X1.27 (BLADE) ×2 IMPLANT
COVER PERINEAL POST (MISCELLANEOUS) ×2 IMPLANT
COVER SURGICAL LIGHT HANDLE (MISCELLANEOUS) ×2 IMPLANT
CUP ACET PINNACLE SECTR 50MM (Hips) IMPLANT
DECANTER SPIKE VIAL GLASS SM (MISCELLANEOUS) ×2 IMPLANT
DRAPE FOOT SWITCH (DRAPES) ×2 IMPLANT
DRAPE STERI IOBAN 125X83 (DRAPES) ×2 IMPLANT
DRAPE U-SHAPE 47X51 STRL (DRAPES) ×4 IMPLANT
DRSG AQUACEL AG ADV 3.5X10 (GAUZE/BANDAGES/DRESSINGS) ×2 IMPLANT
DURAPREP 26ML APPLICATOR (WOUND CARE) ×2 IMPLANT
ELECT REM PT RETURN 15FT ADLT (MISCELLANEOUS) ×2 IMPLANT
GLOVE SRG 8 PF TXTR STRL LF DI (GLOVE) ×1 IMPLANT
GLOVE SURG ENC MOIS LTX SZ6.5 (GLOVE) ×2 IMPLANT
GLOVE SURG ENC MOIS LTX SZ7 (GLOVE) ×2 IMPLANT
GLOVE SURG ENC MOIS LTX SZ8 (GLOVE) ×4 IMPLANT
GLOVE SURG UNDER POLY LF SZ7 (GLOVE) ×2 IMPLANT
GLOVE SURG UNDER POLY LF SZ8 (GLOVE) ×2
GLOVE SURG UNDER POLY LF SZ8.5 (GLOVE) IMPLANT
GOWN STRL REUS W/TWL LRG LVL3 (GOWN DISPOSABLE) ×4 IMPLANT
GOWN STRL REUS W/TWL XL LVL3 (GOWN DISPOSABLE) IMPLANT
HIP BALL CERAMIC 32MM PLUS 9 ×2 IMPLANT
HOLDER FOLEY CATH W/STRAP (MISCELLANEOUS) ×2 IMPLANT
KIT TURNOVER KIT A (KITS) ×2 IMPLANT
LINER MARATHON 32 50 (Hips) ×1 IMPLANT
MANIFOLD NEPTUNE II (INSTRUMENTS) ×2 IMPLANT
PACK ANTERIOR HIP CUSTOM (KITS) ×2 IMPLANT
PENCIL SMOKE EVACUATOR COATED (MISCELLANEOUS) ×2 IMPLANT
PINNACLE SECTOR CUP 50MM (Hips) ×2 IMPLANT
STEM FEM ACTIS STD SZ4 (Stem) ×1 IMPLANT
STRIP CLOSURE SKIN 1/2X4 (GAUZE/BANDAGES/DRESSINGS) ×2 IMPLANT
SUT ETHIBOND NAB CT1 #1 30IN (SUTURE) ×2 IMPLANT
SUT MNCRL AB 4-0 PS2 18 (SUTURE) ×2 IMPLANT
SUT STRATAFIX 0 PDS 27 VIOLET (SUTURE) ×2
SUT VIC AB 2-0 CT1 27 (SUTURE) ×4
SUT VIC AB 2-0 CT1 TAPERPNT 27 (SUTURE) ×2 IMPLANT
SUTURE STRATFX 0 PDS 27 VIOLET (SUTURE) ×1 IMPLANT
SYR 50ML LL SCALE MARK (SYRINGE) IMPLANT
TAPE STRIPS DRAPE STRL (GAUZE/BANDAGES/DRESSINGS) ×1 IMPLANT
TRAY FOLEY MTR SLVR 16FR STAT (SET/KITS/TRAYS/PACK) ×2 IMPLANT
TUBE SUCTION HIGH CAP CLEAR NV (SUCTIONS) ×2 IMPLANT

## 2021-02-06 NOTE — Discharge Instructions (Signed)
°Vanessa Aluisio, MD °Total Joint Specialist °[facility] °[address] °[place], [place] [address] °[phone] ° °ANTERIOR APPROACH TOTAL HIP REPLACEMENT POSTOPERATIVE DIRECTIONS ° ° ° ° °Hip Rehabilitation, Guidelines Following Surgery  °The results of a hip operation are greatly improved after range of motion and muscle strengthening exercises. Follow all safety measures which are given to protect your hip. If any of these exercises cause increased pain or swelling in your joint, decrease the amount until you are comfortable again. Then slowly increase the exercises. Call your caregiver if you have problems or questions.  ° °BLOOD CLOT PREVENTION °Take a 325 mg Aspirin two times a day for three weeks following surgery. Then take an 81 mg Aspirin once a day for three weeks. Then discontinue Aspirin. °You may resume your vitamins/supplements upon discharge from the hospital. °Do not take any NSAIDs (Advil, Aleve, Ibuprofen, Meloxicam, etc.) until you have discontinued the 325 mg Aspirin. ° °HOME CARE INSTRUCTIONS  °Remove items at home which could result in a fall. This includes throw rugs or furniture in walking pathways.  °ICE to the affected hip as frequently as 20-30 minutes an hour and then as needed for pain and swelling. Continue to use ice on the hip for pain and swelling from surgery. You may notice swelling that will progress down to the foot and ankle. This is normal after surgery. Elevate the leg when you are not up walking on it.   °Continue to use the breathing machine which will help keep your temperature down.  It is common for your temperature to cycle up and down following surgery, especially at night when you are not up moving around and exerting yourself.  The breathing machine keeps your lungs expanded and your temperature down. ° °DIET °You may resume your previous home diet once your are discharged from the hospital. ° °DRESSING / WOUND CARE / SHOWERING °You have  an adhesive waterproof bandage over the incision. Leave this in place until your first follow-up appointment. Once you remove this you will not need to place another bandage.  °You may begin showering 3 days following surgery, but do not submerge the incision under water. ° °ACTIVITY °For the first 3-5 days, it is important to rest and keep the operative leg elevated. You should, as a general rule, rest for 50 minutes and walk/stretch for 10 minutes per hour. After 5 days, you may slowly increase activity as tolerated.  °Perform the exercises you were provided twice a day for about 15-20 minutes each session. Begin these 2 days following surgery. °Walk with your walker as instructed. Use the walker until you are comfortable transitioning to a cane. Walk with the cane in the opposite hand of the operative leg. You may discontinue the cane once you are comfortable and walking steadily. °Avoid periods of inactivity such as sitting longer than an hour when not asleep. This helps prevent blood clots.  °Do not drive a car for 6 weeks or until released by your surgeon.  °Do not drive while taking narcotics. ° °TED HOSE STOCKINGS °Wear the elastic stockings on both legs for three weeks following surgery during the day. You may remove them at night while sleeping. ° °WEIGHT BEARING °Weight bearing as tolerated with assist device (walker, cane, etc) as directed, use it as long as suggested by your surgeon or therapist, typically at least 4-6 weeks. ° °POSTOPERATIVE CONSTIPATION PROTOCOL °Constipation - defined medically as fewer than three stools per week and severe constipation as   less than one stool per week. ° °One of the most common issues patients have following surgery is constipation.  Even if you have a regular bowel pattern at home, your normal regimen is likely to be disrupted due to multiple reasons following surgery.  Combination of anesthesia, postoperative narcotics, change in appetite and fluid intake all can  affect your bowels.  In order to avoid complications following surgery, here are some recommendations in order to help you during your recovery period. ° °Colace (docusate) - Pick up an over-the-counter form of Colace or another stool softener and take twice a day as long as you are requiring postoperative pain medications.  Take with a full glass of water daily.  If you experience loose stools or diarrhea, hold the colace until you stool forms back up.  If your symptoms do not get better within 1 week or if they get worse, check with your doctor. °Dulcolax (bisacodyl) - Pick up over-the-counter and take as directed by the product packaging as needed to assist with the movement of your bowels.  Take with a full glass of water.  Use this product as needed if not relieved by Colace only.  °MiraLax (polyethylene glycol) - Pick up over-the-counter to have on hand.  MiraLax is a solution that will increase the amount of water in your bowels to assist with bowel movements.  Take as directed and can mix with a glass of water, juice, soda, coffee, or tea.  Take if you go more than two days without a movement.Do not use MiraLax more than once per day. Call your doctor if you are still constipated or irregular after using this medication for 7 days in a row. ° °If you continue to have problems with postoperative constipation, please contact the office for further assistance and recommendations.  If you experience "the worst abdominal pain ever" or develop nausea or vomiting, please contact the office immediatly for further recommendations for treatment. ° °ITCHING ° If you experience itching with your medications, try taking only a single pain pill, or even half a pain pill at a time.  You can also use Benadryl over the counter for itching or also to help with sleep.  ° °MEDICATIONS °See your medication summary on the “After Visit Summary” that the nursing staff will review with you prior to discharge.  You may have some home  medications which will be placed on hold until you complete the course of blood thinner medication.  It is important for you to complete the blood thinner medication as prescribed by your surgeon.  Continue your approved medications as instructed at time of discharge. ° °PRECAUTIONS °If you experience chest pain or shortness of breath - call 911 immediately for transfer to the hospital emergency department.  °If you develop a fever greater that 101 F, purulent drainage from wound, increased redness or drainage from wound, foul odor from the wound/dressing, or calf pain - CONTACT YOUR SURGEON.   °                                                °FOLLOW-UP APPOINTMENTS °Make sure you keep all of your appointments after your operation with your surgeon and caregivers. You should call the office at the above phone number and make an appointment for approximately two weeks after the date of your surgery or on the   date instructed by your surgeon outlined in the "After Visit Summary". ° °RANGE OF MOTION AND STRENGTHENING EXERCISES  °These exercises are designed to help you keep full movement of your hip joint. Follow your caregiver's or physical therapist's instructions. Perform all exercises about fifteen times, three times per day or as directed. Exercise both hips, even if you have had only one joint replacement. These exercises can be done on a training (exercise) mat, on the floor, on a table or on a bed. Use whatever works the best and is most comfortable for you. Use music or television while you are exercising so that the exercises are a pleasant break in your day. This will make your life better with the exercises acting as a break in routine you can look forward to.  °Lying on your back, slowly slide your foot toward your buttocks, raising your knee up off the floor. Then slowly slide your foot back down until your leg is straight again.  °Lying on your back spread your legs as far apart as you can without causing  discomfort.  °Lying on your side, raise your upper leg and foot straight up from the floor as far as is comfortable. Slowly lower the leg and repeat.  °Lying on your back, tighten up the muscle in the front of your thigh (quadriceps muscles). You can do this by keeping your leg straight and trying to raise your heel off the floor. This helps strengthen the largest muscle supporting your knee.  °Lying on your back, tighten up the muscles of your buttocks both with the legs straight and with the knee bent at a comfortable angle while keeping your heel on the floor.  ° °POST-OPERATIVE OPIOID TAPER INSTRUCTIONS: °It is important to wean off of your opioid medication as soon as possible. If you do not need pain medication after your surgery it is ok to stop day one. °Opioids include: °Codeine, Hydrocodone(Norco, Vicodin), Oxycodone(Percocet, oxycontin) and hydromorphone amongst others.  °Long term and even short term use of opiods can cause: °Increased pain response °Dependence °Constipation °Depression °Respiratory depression °And more.  °Withdrawal symptoms can include °Flu like symptoms °Nausea, vomiting °And more °Techniques to manage these symptoms °Hydrate well °Eat regular healthy meals °Stay active °Use relaxation techniques(deep breathing, meditating, yoga) °Do Not substitute Alcohol to help with tapering °If you have been on opioids for less than two weeks and do not have pain than it is ok to stop all together.  °Plan to wean off of opioids °This plan should start within one week post op of your joint replacement. °Maintain the same interval or time between taking each dose and first decrease the dose.  °Cut the total daily intake of opioids by one tablet each day °Next start to increase the time between doses. °The last dose that should be eliminated is the evening dose.  ° °IF YOU ARE TRANSFERRED TO A SKILLED REHAB FACILITY °If the patient is transferred to a skilled rehab facility following release from the  hospital, a list of the current medications will be sent to the facility for the patient to continue.  When discharged from the skilled rehab facility, please have the facility set up the patient's Home Health Physical Therapy prior to being released. Also, the skilled facility will be responsible for providing the patient with their medications at time of release from the facility to include their pain medication, the muscle relaxants, and their blood thinner medication. If the patient is still at the rehab facility   at time of the two week follow up appointment, the skilled rehab facility will also need to assist the patient in arranging follow up appointment in our office and any transportation needs. ° °MAKE SURE YOU:  °Understand these instructions.  °Get help right away if you are not doing well or get worse.  ° ° °DENTAL ANTIBIOTICS: ° °In most cases prophylactic antibiotics for Dental procdeures after total joint surgery are not necessary. ° °Exceptions are as follows: ° °1. History of prior total joint infection ° °2. Severely immunocompromised (Organ Transplant, cancer chemotherapy, Rheumatoid biologic °meds such as Humera) ° °3. Poorly controlled diabetes (A1C &gt; 8.0, blood glucose over 200) ° °If you have one of these conditions, contact your surgeon for an antibiotic prescription, prior to your °dental procedure.  ° ° °Pick up stool softner and laxative for home use following surgery while on pain medications. °Do not submerge incision under water. °Please use good hand washing techniques while changing dressing each day. °May shower starting three days after surgery. °Please use a clean towel to pat the incision dry following showers. °Continue to use ice for pain and swelling after surgery. °Do not use any lotions or creams on the incision until instructed by your surgeon. ° °

## 2021-02-06 NOTE — Addendum Note (Signed)
Addendum  created 02/06/21 1218 by Marny Lowenstein, CRNA   Intraprocedure Meds edited

## 2021-02-06 NOTE — Transfer of Care (Signed)
Immediate Anesthesia Transfer of Care Note  Patient: Vanessa Heath  Procedure(s) Performed: TOTAL HIP ARTHROPLASTY ANTERIOR APPROACH (Hip)  Patient Location: PACU  Anesthesia Type:MAC and Spinal  Level of Consciousness: awake, alert  and oriented  Airway & Oxygen Therapy: Patient Spontanous Breathing  Post-op Assessment: Report given to RN and Post -op Vital signs reviewed and stable  Post vital signs: Reviewed and stable  Last Vitals:  Vitals Value Taken Time  BP 160/149 02/06/21 0855  Temp    Pulse 66 02/06/21 0856  Resp 15 02/06/21 0856  SpO2 96 % 02/06/21 0856  Vitals shown include unvalidated device data.  Last Pain:  Vitals:   02/06/21 0538  TempSrc: Oral         Complications: No notable events documented.

## 2021-02-06 NOTE — Anesthesia Postprocedure Evaluation (Signed)
Anesthesia Post Note  Patient: Vanessa Heath  Procedure(s) Performed: TOTAL HIP ARTHROPLASTY ANTERIOR APPROACH (Hip)     Patient location during evaluation: PACU Anesthesia Type: Spinal Level of consciousness: awake and alert Pain management: pain level controlled Vital Signs Assessment: post-procedure vital signs reviewed and stable Respiratory status: spontaneous breathing, nonlabored ventilation and respiratory function stable Cardiovascular status: blood pressure returned to baseline and stable Postop Assessment: no apparent nausea or vomiting Anesthetic complications: no   No notable events documented.  Last Vitals:  Vitals:   02/06/21 0945 02/06/21 1000  BP: 131/83 134/81  Pulse: 66 64  Resp: 10 10  Temp: (!) 36.3 C   SpO2: 97% 95%    Last Pain:  Vitals:   02/06/21 1000  TempSrc:   PainSc: 3                  Lowella Curb

## 2021-02-06 NOTE — Anesthesia Procedure Notes (Signed)
Spinal  Patient location during procedure: OR Start time: 02/06/2021 7:30 AM End time: 02/06/2021 7:36 AM Reason for block: surgical anesthesia Staffing Performed: resident/CRNA  Anesthesiologist: Lowella Curb, MD Resident/CRNA: Marny Lowenstein, CRNA Preanesthetic Checklist Completed: patient identified, IV checked, site marked, risks and benefits discussed, surgical consent, monitors and equipment checked, pre-op evaluation and timeout performed Spinal Block Patient position: sitting Prep: DuraPrep Patient monitoring: heart rate, continuous pulse ox and blood pressure Approach: midline Location: L3-4 Injection technique: single-shot Needle Needle type: Quincke  Needle gauge: 22 G Assessment Sensory level: T8 Events: CSF return Additional Notes Pt with scoliosis in lumbar spine; good CSF return

## 2021-02-06 NOTE — Evaluation (Signed)
Physical Therapy Evaluation Patient Details Name: Vanessa Heath MRN: [id number] DOB: Jan 21, 1946 Today's Date: 02/06/2021  History of Present Illness  s/p R DA THA. PMH: HTN, L posterior THA  Clinical Impression  Pt is s/p THA resulting in the deficits listed below (see PT Problem List).  Pt doing exceptionally well today, amb ~ 110' with RW and min/guard. Initiated HEP   Pt will benefit from skilled PT to increase their independence and safety with mobility to allow discharge to the venue listed below.         Recommendations for follow up therapy are one component of a multi-disciplinary discharge planning process, led by the attending physician.  Recommendations may be updated based on patient status, additional functional criteria and insurance authorization.  Follow Up Recommendations Follow surgeon's recommendation for DC plan and follow-up therapies    Equipment Recommendations  Rolling walker with 5" wheels    Recommendations for Other Services       Precautions / Restrictions Precautions Precautions: Fall Restrictions Weight Bearing Restrictions: No Other Position/Activity Restrictions: WBAT      Mobility  Bed Mobility Overal bed mobility: Needs Assistance Bed Mobility: Supine to Sit     Supine to sit: Supervision     General bed mobility comments: for safety, incr time    Transfers Overall transfer level: Needs assistance Equipment used: Rolling walker (2 wheeled) Transfers: Sit to/from Stand Sit to Stand: Supervision;Min guard         General transfer comment: cues for hand placement  Ambulation/Gait Ambulation/Gait assistance: Min guard Gait Distance (Feet): 110 Feet Assistive device: Rolling walker (2 wheeled) Gait Pattern/deviations: Step-to pattern;Step-through pattern;Decreased stance time - right     General Gait Details: cues for initial sequence, beginning step through gait. min/guard for safety  Stairs             Wheelchair Mobility    Modified Rankin (Stroke Patients Only)       Balance                                             Pertinent Vitals/Pain Pain Assessment: 0-10 Pain Score: 1  Pain Location: right hip Pain Descriptors / Indicators: Discomfort Pain Intervention(s): Limited activity within patient's tolerance;Monitored during session;Premedicated before session;Repositioned    Home Living Family/patient expects to be discharged to:: Private residence Living Arrangements: Spouse/significant other Available Help at Discharge: Family Type of Home: House Home Access: Stairs to enter   Secretary/administrator of Steps: 1 and  threshold Home Layout: One level Home Equipment: Crutches      Prior Function Level of Independence: Independent               Hand Dominance        Extremity/Trunk Assessment   Upper Extremity Assessment Upper Extremity Assessment: Overall WFL for tasks assessed    Lower Extremity Assessment Lower Extremity Assessment: RLE deficits/detail RLE Deficits / Details: grossly 3/5 hip, AAROM WFL       Communication   Communication: No difficulties  Cognition Arousal/Alertness: Awake/alert Behavior During Therapy: WFL for tasks assessed/performed Overall Cognitive Status: Within Functional Limits for tasks assessed                                        General Comments  Exercises Ankle pumps x15 Quad sets x 5 Heel slides x 10   Assessment/Plan    PT Assessment Patient needs continued PT services  PT Problem List Decreased strength;Decreased mobility;Decreased knowledge of use of DME       PT Treatment Interventions DME instruction;Therapeutic activities;Gait training;Functional mobility training;Therapeutic exercise;Patient/family education;Stair training    PT Goals (Current goals can be found in the Care Plan section)  Acute Rehab PT Goals Patient Stated Goal: home, have less pain  with amb PT Goal Formulation: With patient Time For Goal Achievement: 02/13/21 Potential to Achieve Goals: Good    Frequency 7X/week   Barriers to discharge        Co-evaluation               AM-PAC PT "6 Clicks" Mobility  Outcome Measure Help needed turning from your back to your side while in a flat bed without using bedrails?: A Little Help needed moving from lying on your back to sitting on the side of a flat bed without using bedrails?: A Little Help needed moving to and from a bed to a chair (including a wheelchair)?: A Little Help needed standing up from a chair using your arms (e.g., wheelchair or bedside chair)?: A Little Help needed to walk in hospital room?: A Little Help needed climbing 3-5 steps with a railing? : A Little 6 Click Score: 18    End of Session Equipment Utilized During Treatment: Gait belt Activity Tolerance: Patient tolerated treatment well Patient left: in chair;with call bell/phone within reach;with chair alarm set Nurse Communication: Mobility status PT Visit Diagnosis: Other abnormalities of gait and mobility (R26.89);Difficulty in walking, not elsewhere classified (R26.2)    Time: 8185-6314 PT Time Calculation (min) (ACUTE ONLY): 22 min   Charges:   PT Evaluation $PT Eval Low Complexity: 1 Low          Keoshia Steinmetz, PT  Acute Rehab Dept ([facility]) [phone] Pager [phone]  02/06/2021   [facility] 02/06/2021, 3:26 PM

## 2021-02-06 NOTE — Addendum Note (Signed)
Addendum  created 02/06/21 1223 by Marny Lowenstein, CRNA   Intraprocedure Meds edited

## 2021-02-06 NOTE — Interval H&P Note (Signed)
History and Physical Interval Note:  02/06/2021 6:23 AM  Vanessa Heath  has presented today for surgery, with the diagnosis of Osteoarthritis hip.  The various methods of treatment have been discussed with the patient and family. After consideration of risks, benefits and other options for treatment, the patient has consented to  Procedure(s): TOTAL HIP ARTHROPLASTY ANTERIOR APPROACH (N/A) as a surgical intervention.  The patient's history has been reviewed, patient examined, no change in status, stable for surgery.  I have reviewed the patient's chart and labs.  Questions were answered to the patient's satisfaction.     Vanessa Heath

## 2021-02-06 NOTE — Plan of Care (Signed)
  Problem: Education: Goal: Knowledge of the prescribed therapeutic regimen will improve Outcome: Progressing Goal: Understanding of discharge needs will improve Outcome: Progressing   Problem: Pain Management: Goal: Pain level will decrease with appropriate interventions Outcome: Progressing   

## 2021-02-06 NOTE — Op Note (Signed)
OPERATIVE REPORT- TOTAL HIP ARTHROPLASTY   PREOPERATIVE DIAGNOSIS: Osteoarthritis of the Right hip.   POSTOPERATIVE DIAGNOSIS: Osteoarthritis of the Right  hip.   PROCEDURE: Right total hip arthroplasty, anterior approach.   SURGEON: Ollen Gross, MD   ASSISTANT: Arther Abbott, PA-C  ANESTHESIA:  Spinal  ESTIMATED BLOOD LOSS:-200 mL    DRAINS: Hemovac x1.   COMPLICATIONS: None   CONDITION: PACU - hemodynamically stable.   BRIEF CLINICAL NOTE: Vanessa Heath is a 75 y.o. female who has advanced end-  stage arthritis of their Right  hip with progressively worsening pain and  dysfunction.The patient has failed nonoperative management and presents for  total hip arthroplasty.   PROCEDURE IN DETAIL: After successful administration of spinal  anesthetic, the traction boots for the [facility] bed were placed on both  feet and the patient was placed onto the [facility] bed, boots placed into the leg  holders. The Right hip was then isolated from the perineum with plastic  drapes and prepped and draped in the usual sterile fashion. ASIS and  greater trochanter were marked and a oblique incision was made, starting  at about 1 cm lateral and 2 cm distal to the ASIS and coursing towards  the anterior cortex of the femur. The skin was cut with a 10 blade  through subcutaneous tissue to the level of the fascia overlying the  tensor fascia lata muscle. The fascia was then incised in line with the  incision at the junction of the anterior third and posterior 2/3rd. The  muscle was teased off the fascia and then the interval between the TFL  and the rectus was developed. The Hohmann retractor was then placed at  the top of the femoral neck over the capsule. The vessels overlying the  capsule were cauterized and the fat on top of the capsule was removed.  A Hohmann retractor was then placed anterior underneath the rectus  femoris to give exposure to the entire anterior capsule. A  T-shaped  capsulotomy was performed. The edges were tagged and the femoral head  was identified.       Osteophytes are removed off the superior acetabulum.  The femoral neck was then cut in situ with an oscillating saw. Traction  was then applied to the left lower extremity utilizing the [facility]  traction. The femoral head was then removed. Retractors were placed  around the acetabulum and then circumferential removal of the labrum was  performed. Osteophytes were also removed. Reaming starts at 45 mm to  medialize and  Increased in 2 mm increments to 49 mm. We reamed in  approximately 40 degrees of abduction, 20 degrees anteversion. A 50 mm  pinnacle acetabular shell was then impacted in anatomic position under  fluoroscopic guidance with excellent purchase. We did not need to place  any additional dome screws. A 32 mm neutral + 4 marathon liner was then  placed into the acetabular shell.       The femoral lift was then placed along the lateral aspect of the femur  just distal to the vastus ridge. The leg was  externally rotated and capsule  was stripped off the inferior aspect of the femoral neck down to the  level of the lesser trochanter, this was done with electrocautery. The femur was lifted after this was performed. The  leg was then placed in an extended and adducted position essentially delivering the femur. We also removed the capsule superiorly and the piriformis from the piriformis  fossa to gain excellent exposure of the  proximal femur. Rongeur was used to remove some cancellous bone to get  into the lateral portion of the proximal femur for placement of the  initial starter reamer. The starter broaches was placed  the starter broach  and was shown to go down the center of the canal. Broaching  with the Actis system was then performed starting at size 0  coursing  Up to size 4. A size 4 had excellent torsional and rotational  and axial stability. The trial standard offset neck was  then placed  with a 32 + 9 trial head. The hip was then reduced. We confirmed that  the stem was in the canal both on AP and lateral x-rays. It also has excellent sizing. The hip was reduced with outstanding stability through full extension and full external rotation.. AP pelvis was taken and the leg lengths were measured and found to be equal. Hip was then dislocated again and the femoral head and neck removed. The  femoral broach was removed. Size 4 Actis stem with a standard offset  neck was then impacted into the femur following native anteversion. Has  excellent purchase in the canal. Excellent torsional and rotational and  axial stability. It is confirmed to be in the canal on AP and lateral  fluoroscopic views. The 32 + 9 ceramic head was placed and the hip  reduced with outstanding stability. Again AP pelvis was taken and it  confirmed that the leg lengths were equal. The wound was then copiously  irrigated with saline solution and the capsule reattached and repaired  with Ethibond suture. 30 ml of .25% Bupivicaine was  injected into the capsule and into the edge of the tensor fascia lata as well as subcutaneous tissue. The fascia overlying the tensor fascia lata was then closed with a running #1 V-Loc. Subcu was closed with interrupted 2-0 Vicryl and subcuticular running 4-0 Monocryl. Incision was cleaned  and dried. Steri-Strips and a bulky sterile dressing applied. The patient was awakened and transported to  recovery in stable condition.        Please note that a surgical assistant was a medical necessity for this procedure to perform it in a safe and expeditious manner. Assistant was necessary to provide appropriate retraction of vital neurovascular structures and to prevent femoral fracture and allow for anatomic placement of the prosthesis.  Ollen Gross, M.D.

## 2021-02-07 ENCOUNTER — Other Ambulatory Visit: Payer: Self-pay

## 2021-02-07 ENCOUNTER — Encounter (HOSPITAL_COMMUNITY): Payer: Self-pay | Admitting: Orthopedic Surgery

## 2021-02-07 DIAGNOSIS — M1611 Unilateral primary osteoarthritis, right hip: Secondary | ICD-10-CM | POA: Diagnosis not present

## 2021-02-07 LAB — CBC
HCT: 34.7 % — ABNORMAL LOW (ref 36.0–46.0)
Hemoglobin: 11.9 g/dL — ABNORMAL LOW (ref 12.0–15.0)
MCH: 32.7 pg (ref 26.0–34.0)
MCHC: 34.3 g/dL (ref 30.0–36.0)
MCV: 95.3 fL (ref 80.0–100.0)
Platelets: 231 10*3/uL (ref 150–400)
RBC: 3.64 MIL/uL — ABNORMAL LOW (ref 3.87–5.11)
RDW: 12.2 % (ref 11.5–15.5)
WBC: 15 10*3/uL — ABNORMAL HIGH (ref 4.0–10.5)
nRBC: 0 % (ref 0.0–0.2)

## 2021-02-07 LAB — BASIC METABOLIC PANEL
Anion gap: 7 (ref 5–15)
BUN: 6 mg/dL — ABNORMAL LOW (ref 8–23)
CO2: 24 mmol/L (ref 22–32)
Calcium: 9.5 mg/dL (ref 8.9–10.3)
Chloride: 105 mmol/L (ref 98–111)
Creatinine, Ser: 0.39 mg/dL — ABNORMAL LOW (ref 0.44–1.00)
GFR, Estimated: >60 mL/min (ref >60)
Glucose, Bld: 145 mg/dL — ABNORMAL HIGH (ref 70–99)
Potassium: 3.6 mmol/L (ref 3.5–5.1)
Sodium: 136 mmol/L (ref 135–145)

## 2021-02-07 MED ORDER — ASPIRIN 325 MG PO TBEC: 325.0000 mg | DELAYED_RELEASE_TABLET | Freq: Two times a day (BID) | ORAL | 0 refills | Status: DC

## 2021-02-07 MED ORDER — SODIUM CHLORIDE 0.9 % IV BOLUS
500.0000 mL | Freq: Once | INTRAVENOUS | Status: AC
  Administered 2021-02-07: 500 mL via INTRAVENOUS

## 2021-02-07 MED ORDER — HYDROCODONE-ACETAMINOPHEN 5-325 MG PO TABS: 1.0000 | ORAL_TABLET | Freq: Four times a day (QID) | ORAL | 0 refills | Status: DC | PRN

## 2021-02-07 MED ORDER — TRAMADOL HCL 50 MG PO TABS: 50.0000 mg | ORAL_TABLET | Freq: Four times a day (QID) | ORAL | 0 refills | Status: DC | PRN

## 2021-02-07 MED ORDER — METHOCARBAMOL 500 MG PO TABS
500.0000 mg | ORAL_TABLET | Freq: Four times a day (QID) | ORAL | 0 refills | Status: DC | PRN
Start: 2021-02-07 — End: 2023-09-25

## 2021-02-07 NOTE — Progress Notes (Signed)
Physical Therapy Treatment Patient Details Name: Vanessa Heath MRN: [id number] DOB: Sep 07, 1945 Today's Date: 02/07/2021   History of Present Illness s/p R DA THA. PMH: HTN, L posterior THA    PT Comments    Pt did well with second session today. She ambulated 200' with RW, no loss of balance, no nausea/lightheadedness. Instructed pt/spouse in THA HEP. She demonstrates good understanding of HEP and is ready to DC home from a PT standpoint.    Recommendations for follow up therapy are one component of a multi-disciplinary discharge planning process, led by the attending physician.  Recommendations may be updated based on patient status, additional functional criteria and insurance authorization.  Follow Up Recommendations  Follow surgeon's recommendation for DC plan and follow-up therapies     Equipment Recommendations  Rolling walker with 5" wheels    Recommendations for Other Services       Precautions / Restrictions Precautions Precautions: Fall Restrictions Weight Bearing Restrictions: No Other Position/Activity Restrictions: WBAT     Mobility  Bed Mobility Overal bed mobility: Modified Independent Bed Mobility: Supine to Sit     Supine to sit: Modified independent (Device/Increase time);HOB elevated     General bed mobility comments: for safety, incr time, HOB up, used belt as leg lifter    Transfers Overall transfer level: Modified independent Equipment used: Rolling walker (2 wheeled) Transfers: Sit to/from Stand Sit to Stand: Modified independent (Device/Increase time)         General transfer comment: cues for hand placement  Ambulation/Gait Ambulation/Gait assistance: Modified independent (Device/Increase time) Gait Distance (Feet): 200 Feet Assistive device: Rolling walker (2 wheeled) Gait Pattern/deviations: Step-to pattern;Step-through pattern;Decreased stance time - right Gait velocity: WFL   General Gait Details: steady, no  nausea/lightheadedness   Stairs Stairs: Yes Stairs assistance: Min assist Stair Management: No rails;Forwards;With walker Number of Stairs: 1 General stair comments: min A to manage RW, VCs sequencing   Wheelchair Mobility    Modified Rankin (Stroke Patients Only)       Balance Overall balance assessment: Modified Independent                                          Cognition Arousal/Alertness: Awake/alert Behavior During Therapy: WFL for tasks assessed/performed Overall Cognitive Status: Within Functional Limits for tasks assessed                                        Exercises Total Joint Exercises Ankle Circles/Pumps: AROM;Both;10 reps;Supine Quad Sets: AROM;Right;5 reps;Supine Short Arc Quad: AROM;Right;10 reps;Supine Heel Slides: AAROM;Right;10 reps;Supine Hip ABduction/ADduction: AAROM;Right;10 reps;Supine Long Arc Quad: AROM;Right;10 reps;Seated    General Comments        Pertinent Vitals/Pain Pain Score: 6  Pain Location: right hip Pain Descriptors / Indicators: Discomfort Pain Intervention(s): Limited activity within patient's tolerance;Monitored during session;Repositioned;Ice applied    Home Living Family/patient expects to be discharged to:: Private residence Living Arrangements: Spouse/significant other                  Prior Function            PT Goals (current goals can now be found in the care plan section) Acute Rehab PT Goals Patient Stated Goal: home, have less pain with amb, walk hills in neighborhood PT Goal Formulation: With patient Time  For Goal Achievement: 02/13/21 Potential to Achieve Goals: Good Progress towards PT goals: Progressing toward goals    Frequency    7X/week      PT Plan Current plan remains appropriate    Co-evaluation              AM-PAC PT "6 Clicks" Mobility   Outcome Measure  Help needed turning from your back to your side while in a flat bed  without using bedrails?: A Little Help needed moving from lying on your back to sitting on the side of a flat bed without using bedrails?: A Little Help needed moving to and from a bed to a chair (including a wheelchair)?: None Help needed standing up from a chair using your arms (e.g., wheelchair or bedside chair)?: None Help needed to walk in hospital room?: None Help needed climbing 3-5 steps with a railing? : A Little 6 Click Score: 21    End of Session Equipment Utilized During Treatment: Gait belt Activity Tolerance: Patient tolerated treatment well Patient left: in chair;with call bell/phone within reach;with chair alarm set;with family/visitor present Nurse Communication: Mobility status PT Visit Diagnosis: Other abnormalities of gait and mobility (R26.89);Difficulty in walking, not elsewhere classified (R26.2)     Time: 5945-8592 PT Time Calculation (min) (ACUTE ONLY): 19 min  Charges:   $Therapeutic Exercise: 8-22 mins                    Ralene Bathe Kistler PT 02/07/2021  Acute Rehabilitation Services Pager [phone] Office [phone]

## 2021-02-07 NOTE — Progress Notes (Signed)
RN reviewed discharge instructions with patient and family. All questions answered.   Paperwork given. Prescriptions electronically sent to patient pharmacy.    NT rolled patient down with all belongings to family car.     Kelen Laura, RN  

## 2021-02-07 NOTE — TOC Initial Note (Signed)
Transition of Care [facility]) - Initial/Assessment Note    Patient Details  Name: Vanessa Heath MRN: [id number] Date of Birth: 06/21/1945  Transition of Care [facility]) CM/SW Contact:    Golda Acre, RN Phone Number: 02/07/2021, 9:36 AM  Clinical Narrative:                 Rolling w=alker ordered through [employer]  Expected Discharge Plan: Home/Self Care Barriers to Discharge: No Barriers Identified   Patient Goals and CMS Choice Patient states their goals for this hospitalization and ongoing recovery are:: to go home CMS Medicare.gov Compare Post Acute Care list provided to:: Patient    Expected Discharge Plan and Services Expected Discharge Plan: Home/Self Care   Discharge Planning Services: CM Consult Post Acute Care Choice: Durable Medical Equipment Living arrangements for the past 2 months: Single Family Home Expected Discharge Date: 02/07/21               DME Arranged: Dan Humphreys rolling DME Agency: [employer] Date DME Agency Contacted: 02/07/21 Time DME Agency Contacted: [phone] Representative spoke with at DME Agency: nathan            Prior Living Arrangements/Services Living arrangements for the past 2 months: Single Family Home Lives with:: Spouse Patient language and need for interpreter reviewed:: Yes Do you feel safe going back to the place where you live?: Yes            Criminal Activity/Legal Involvement Pertinent to Current Situation/Hospitalization: No - Comment as needed  Activities of Daily Living Home Assistive Devices/Equipment: Cane (specify quad or straight), Raised toilet seat with rails, Grab bars around toilet, Grab bars in shower, Built-in shower seat ADL Screening (condition at time of admission) Patient's cognitive ability adequate to safely complete daily activities?: Yes Is the patient deaf or have difficulty hearing?: No Does the patient have difficulty seeing, even when wearing glasses/contacts?: No Does the patient have difficulty  concentrating, remembering, or making decisions?: No Patient able to express need for assistance with ADLs?: Yes Does the patient have difficulty dressing or bathing?: No Independently performs ADLs?: Yes (appropriate for developmental age) Does the patient have difficulty walking or climbing stairs?: No Weakness of Legs: None Weakness of Arms/Hands: None  Permission Sought/Granted                  Emotional Assessment Appearance:: Appears stated age     Orientation: : Oriented to Self, Oriented to  Time, Oriented to Place, Oriented to Situation Alcohol / Substance Use: Not Applicable Psych Involvement: No (comment)  Admission diagnosis:  S/P total right hip arthroplasty [U13.244] Patient Active Problem List   Diagnosis Date Noted   OA (osteoarthritis) of hip 02/06/2021   S/P total right hip arthroplasty 02/06/2021   PCP:  Merri Brunette, MD Pharmacy:   [id number] - [place], [place] - [address] AT [facility] [address] [place] [address] Phone: [phone] Fax: [phone]     Social Determinants of Health (SDOH) Interventions    Readmission Risk Interventions No flowsheet data found.

## 2021-02-07 NOTE — Progress Notes (Signed)
   Subjective: 1 Day Post-Op Procedure(s) (LRB): TOTAL HIP ARTHROPLASTY ANTERIOR APPROACH (N/A) Patient reports pain as mild.   Patient seen in rounds by Dr. Lequita Halt. Patient is well, and has had no acute complaints or problems. Denies SOB, chest pain, or calf pain. No acute overnight events. Ambulated 110 feet with therapy yesterday. Will continue therapy today.    Objective: Vital signs in last 24 hours: Temp:  [96.2 F (35.7 C)-97.9 F (36.6 C)] 97.4 F (36.3 C) (10/06 0456) Pulse Rate:  [64-82] 65 (10/06 0456) Resp:  [8-22] 17 (10/06 0456) BP: (110-150)/(65-98) 125/74 (10/06 0456) SpO2:  [94 %-99 %] 98 % (10/06 0456)  Intake/Output from previous day:  Intake/Output Summary (Last 24 hours) at 02/07/2021 0734 Last data filed at 02/07/2021 0600 Gross per 24 hour  Intake 2540 ml  Output 4500 ml  Net -1960 ml     Intake/Output this shift: No intake/output data recorded.  Labs: Recent Labs    02/07/21 0321  HGB 11.9*   Recent Labs    02/07/21 0321  WBC 15.0*  RBC 3.64*  HCT 34.7*  PLT 231   Recent Labs    02/07/21 0321  NA 136  K 3.6  CL 105  CO2 24  BUN 6*  CREATININE 0.39*  GLUCOSE 145*  CALCIUM 9.5   No results for input(s): LABPT, INR in the last 72 hours.  Exam: General - Patient is Alert and Oriented Extremity - Neurologically intact Neurovascular intact Intact pulses distally Dorsiflexion/Plantar flexion intact Dressing - dressing C/D/I Motor Function - intact, moving foot and toes well on exam.   Past Medical History:  Diagnosis Date   Arthritis    Elevated LDH    Hypertension    Hypothyroidism     Assessment/Plan: 1 Day Post-Op Procedure(s) (LRB): TOTAL HIP ARTHROPLASTY ANTERIOR APPROACH (N/A) Principal Problem:   OA (osteoarthritis) of hip Active Problems:   S/P total right hip arthroplasty  Estimated body mass index is 29.24 kg/m as calculated from the following:   Height as of 02/04/21: 5' 1.25" (1.556 m).   Weight as of  02/04/21: 70.8 kg. Up with therapy  DVT Prophylaxis - Aspirin and TED hose Weight bearing as tolerated. Continue therapy.  Plan is to go Home after hospital stay.   Plan for 1-2 sessions with PT this morning, and if meeting goals, will plan for discharge this afternoon.   Patient to follow up in two weeks with Dr. Lequita Halt in clinic.   The PDMP database was reviewed today prior to any opioid medications being prescribed to this patient.Nelia Shi, MBA, PA-C Orthopedic Surgery [phone] 02/07/2021, 7:34 AM

## 2021-02-07 NOTE — Progress Notes (Addendum)
Physical Therapy Treatment Patient Details Name: Vanessa Heath MRN: [id number] DOB: 25-Feb-1946 Today's Date: 02/07/2021   History of Present Illness s/p R DA THA. PMH: HTN, L posterior THA    PT Comments    Pt ambulated 180' with RW and completed stair training. Upon return to her room she sat on the toilet to attempt to urinate (unsuccessfully), where she became nauseous and lightheaded. BP 64/38 sitting,  HR 72, SaO2 96% on room air. Nurse tech assisted with 2 person total assist transfer to recliner, pt was reclined, BP 79/53, then after 2 minutes 98/65. RN aware. Will return for second session later today. Pt did not fully lose consciousness but was significantly less responsive during episode on toilet. She was fully responsive and oriented at end of session.     Recommendations for follow up therapy are one component of a multi-disciplinary discharge planning process, led by the attending physician.  Recommendations may be updated based on patient status, additional functional criteria and insurance authorization.  Follow Up Recommendations  Follow surgeon's recommendation for DC plan and follow-up therapies     Equipment Recommendations  Rolling walker with 5" wheels    Recommendations for Other Services       Precautions / Restrictions Precautions Precautions: Fall Restrictions Weight Bearing Restrictions: No Other Position/Activity Restrictions: WBAT     Mobility  Bed Mobility Overal bed mobility: Modified Independent Bed Mobility: Supine to Sit     Supine to sit: Modified independent (Device/Increase time);HOB elevated     General bed mobility comments: for safety, incr time, HOB up, used belt as leg lifter    Transfers Overall transfer level: Needs assistance Equipment used: Rolling walker (2 wheeled) Transfers: Sit to/from Stand Sit to Stand: Supervision         General transfer comment: cues for hand placement  Ambulation/Gait Ambulation/Gait  assistance: Supervision Gait Distance (Feet): 180 Feet Assistive device: Rolling walker (2 wheeled) Gait Pattern/deviations: Step-to pattern;Step-through pattern;Decreased stance time - right     General Gait Details: cues for initial sequence, beginning step through gait. min/guard for safety. after walking pt attempted to urinate on toilet, she became nauseous and lightheaded, BP 64/38. NT assisted with 2 person maxA transfer to recliner. In reclined position BP up to 79/52, then after 2 minutes up to 98/65. RN aware.   Stairs Stairs: Yes Stairs assistance: Min assist Stair Management: No rails;Forwards;With walker Number of Stairs: 1 General stair comments: min A to manage RW, VCs sequencing   Wheelchair Mobility    Modified Rankin (Stroke Patients Only)       Balance Overall balance assessment: Modified Independent                                          Cognition Arousal/Alertness: Awake/alert Behavior During Therapy: WFL for tasks assessed/performed Overall Cognitive Status: Within Functional Limits for tasks assessed                                        Exercises Total Joint Exercises Ankle Circles/Pumps: AROM;Both;10 reps;Supine Long Arc Quad: AROM;Right;10 reps;Seated    General Comments        Pertinent Vitals/Pain Pain Score: 3  Pain Location: right hip Pain Descriptors / Indicators: Discomfort Pain Intervention(s): Limited activity within patient's tolerance;Monitored during session;Ice applied  Home Living Family/patient expects to be discharged to:: Private residence Living Arrangements: Spouse/significant other                  Prior Function            PT Goals (current goals can now be found in the care plan section) Acute Rehab PT Goals Patient Stated Goal: home, have less pain with amb, walk hills in neighborhood PT Goal Formulation: With patient Time For Goal Achievement:  02/13/21 Potential to Achieve Goals: Good Progress towards PT goals: Progressing toward goals    Frequency    7X/week      PT Plan Current plan remains appropriate    Co-evaluation              AM-PAC PT "6 Clicks" Mobility   Outcome Measure  Help needed turning from your back to your side while in a flat bed without using bedrails?: A Little Help needed moving from lying on your back to sitting on the side of a flat bed without using bedrails?: A Little Help needed moving to and from a bed to a chair (including a wheelchair)?: None Help needed standing up from a chair using your arms (e.g., wheelchair or bedside chair)?: None Help needed to walk in hospital room?: None Help needed climbing 3-5 steps with a railing? : A Little 6 Click Score: 21    End of Session Equipment Utilized During Treatment: Gait belt Activity Tolerance: Patient tolerated treatment well Patient left: in chair;with call bell/phone within reach;with chair alarm set;with family/visitor present Nurse Communication: Mobility status PT Visit Diagnosis: Other abnormalities of gait and mobility (R26.89);Difficulty in walking, not elsewhere classified (R26.2)     Time: 1517-6160 PT Time Calculation (min) (ACUTE ONLY): 33 min  Charges:  $Gait Training: 8-22 mins $Therapeutic Activity: 8-22 mins                    Ralene Bathe Kistler PT 02/07/2021  Acute Rehabilitation Services Pager [phone] Office [phone]

## 2021-02-10 LAB — TYPE AND SCREEN
ABO/RH(D): A POS
Antibody Screen: POSITIVE
Donor AG Type: NEGATIVE
Donor AG Type: NEGATIVE
Unit division: 0
Unit division: 0

## 2021-02-10 LAB — BPAM RBC
Blood Product Expiration Date: 202210132359
Blood Product Expiration Date: 202210132359
Unit Type and Rh: 5100
Unit Type and Rh: 5100

## 2021-02-12 NOTE — Discharge Summary (Signed)
Physician Discharge Summary   Patient ID: Vanessa Heath MRN: [id number] DOB/AGE: 1946/02/16 75 y.o.  Admit date: 02/06/2021 Discharge date: 02/07/2021  Primary Diagnosis:  s/p Right THA  Admission Diagnoses:  Past Medical History:  Diagnosis Date   Arthritis    Elevated LDH    Hypertension    Hypothyroidism    Discharge Diagnoses:   Principal Problem:   OA (osteoarthritis) of hip Active Problems:   S/P total right hip arthroplasty  Estimated body mass index is 29.25 kg/m as calculated from the following:   Height as of this encounter: 5' 1.25" (1.556 m).   Weight as of this encounter: 70.8 kg.  Procedure:  Procedure(s) (LRB): TOTAL HIP ARTHROPLASTY ANTERIOR APPROACH (N/A)   Consults: None  HPI: Vanessa Heath is a 75 y.o. female who has advanced end-  stage arthritis of their Right  hip with progressively worsening pain and  dysfunction.The patient has failed nonoperative management and presents for  total hip arthroplasty.   Laboratory Data: Admission on 02/06/2021, Discharged on 02/07/2021  Component Date Value Ref Range Status   ABO/RH(D) 02/06/2021 A POS   Final   Antibody Screen 02/06/2021 POS   Final   Sample Expiration 02/06/2021 02/09/2021,2359   Final   Antibody Identification 02/06/2021 ANTI K   Final   Unit Number 02/06/2021 [id number]   Final   Blood Component Type 02/06/2021 RED CELLS,LR   Final   Unit division 02/06/2021 00   Final   Status of Unit 02/06/2021 REL FROM [facility]   Final   Transfusion Status 02/06/2021 OK TO TRANSFUSE   Final   Crossmatch Result 02/06/2021 COMPATIBLE   Final   Donor AG Type 02/06/2021 NEGATIVE FOR KELL ANTIGEN   Final   Unit Number 02/06/2021 [id number]   Final   Blood Component Type 02/06/2021 RED CELLS,LR   Final   Unit division 02/06/2021 00   Final   Status of Unit 02/06/2021 REL FROM [facility]   Final   Transfusion Status 02/06/2021 OK TO TRANSFUSE   Final   Crossmatch Result 02/06/2021 COMPATIBLE    Final   Donor AG Type 02/06/2021 NEGATIVE FOR KELL ANTIGEN   Final   Blood Product Unit Number 02/06/2021 [id number]   Final   PRODUCT CODE 02/06/2021 [id number]   Final   Unit Type and Rh 02/06/2021 5100   Final   Blood Product Expiration Date 02/06/2021 [id number]   Final   Blood Product Unit Number 02/06/2021 [id number]   Final   PRODUCT CODE 02/06/2021 [id number]   Final   Unit Type and Rh 02/06/2021 5100   Final   Blood Product Expiration Date 02/06/2021 [id number]   Final   WBC 02/07/2021 15.0 (A) 4.0 - 10.5 K/uL Final   RBC 02/07/2021 3.64 (A) 3.87 - 5.11 MIL/uL Final   Hemoglobin 02/07/2021 11.9 (A) 12.0 - 15.0 g/dL Final   HCT 30/09/1100 34.7 (A) 36.0 - 46.0 % Final   MCV 02/07/2021 95.3  80.0 - 100.0 fL Final   MCH 02/07/2021 32.7  26.0 - 34.0 pg Final   MCHC 02/07/2021 34.3  30.0 - 36.0 g/dL Final   RDW 03/21/3566 12.2  11.5 - 15.5 % Final   Platelets 02/07/2021 231  150 - 400 K/uL Final   nRBC 02/07/2021 0.0  0.0 - 0.2 % Final   Performed at [facility], [address], [place], [place] [address]   Sodium 02/07/2021 136  135 - 145 mmol/L Final   Potassium 02/07/2021 3.6  3.5 -  5.1 mmol/L Final   Chloride 02/07/2021 105  98 - 111 mmol/L Final   CO2 02/07/2021 24  22 - 32 mmol/L Final   Glucose, Bld 02/07/2021 145 (A) 70 - 99 mg/dL Final   Glucose reference range applies only to samples taken after fasting for at least 8 hours.   BUN 02/07/2021 6 (A) 8 - 23 mg/dL Final   Creatinine, Ser 02/07/2021 0.39 (A) 0.44 - 1.00 mg/dL Final   Calcium 02/58/5277 9.5  8.9 - 10.3 mg/dL Final   GFR, Estimated 02/07/2021 >60  >60 mL/min Final   Comment: (NOTE) Calculated using the CKD-EPI Creatinine Equation (2021)    Anion gap 02/07/2021 7  5 - 15 Final   Performed at [facility], [address], [place], [place] [address]  Admission on 02/04/2021, Discharged on 02/04/2021  Component Date Value Ref Range Status   ABO/RH(D) 02/04/2021 A  POS   Final   Antibody Screen 02/04/2021 POS   Final   Sample Expiration 02/04/2021    Final                   Value:02/05/2021,2359 Performed at [facility], [address], [place], [place] [address]   Orders Only on 01/31/2021  Component Date Value Ref Range Status   SARS Coronavirus 2 01/31/2021 RESULT: NEGATIVE   Final   Comment: RESULT: NEGATIVESARS-CoV-2 INTERPRETATION:A NEGATIVE  test result means that SARS-CoV-2 RNA was not present in the specimen above the limit of detection of this test. This does not preclude a possible SARS-CoV-2 infection and should not be used as the  sole basis for patient management decisions. Negative results must be combined with clinical observations, patient history, and epidemiological information. Optimum specimen types and timing for peak viral levels during infections caused by SARS-CoV-2  have not been determined. Collection of multiple specimens or types of specimens may be necessary to detect virus. Improper specimen collection and handling, sequence variability under primers/probes, or organism present below the limit of detection may  lead to false negative results. Positive and negative predictive values of testing are highly dependent on prevalence. False negative test results are more likely when prevalence of disease is high.The expected result is NEGATIVE.Fact S                          heet for  Healthcare Providers: [employer] Sheet for Patients: https://poole-freeman.org/ Reference Range - Negative   Hospital Outpatient Visit on 01/25/2021  Component Date Value Ref Range Status   MRSA, PCR 01/25/2021 NEGATIVE  NEGATIVE Final   Staphylococcus aureus 01/25/2021 POSITIVE (A) NEGATIVE Final   Comment: (NOTE) The Xpert SA Assay (FDA approved for NASAL specimens in patients 88 years of age and older), is one component of a comprehensive surveillance program. It is not  intended to diagnose infection nor to guide or monitor treatment. Performed at [facility], [address], [place], [place] [address]    WBC 01/25/2021 6.6  4.0 - 10.5 K/uL Final   RBC 01/25/2021 4.13  3.87 - 5.11 MIL/uL Final   Hemoglobin 01/25/2021 13.4  12.0 - 15.0 g/dL Final   HCT 00/86/7619 40.5  36.0 - 46.0 % Final   MCV 01/25/2021 98.1  80.0 - 100.0 fL Final   MCH 01/25/2021 32.4  26.0 - 34.0 pg Final   MCHC 01/25/2021 33.1  30.0 - 36.0 g/dL Final   RDW 50/93/2671 12.4  11.5 - 15.5 % Final  Platelets 01/25/2021 248  150 - 400 K/uL Final   nRBC 01/25/2021 0.0  0.0 - 0.2 % Final   Performed at [facility], [address] [place], [place] [address]   Sodium 01/25/2021 132 (A) 135 - 145 mmol/L Final   Potassium 01/25/2021 4.7  3.5 - 5.1 mmol/L Final   Chloride 01/25/2021 101  98 - 111 mmol/L Final   CO2 01/25/2021 24  22 - 32 mmol/L Final   Glucose, Bld 01/25/2021 110 (A) 70 - 99 mg/dL Final   Glucose reference range applies only to samples taken after fasting for at least 8 hours.   BUN 01/25/2021 8  8 - 23 mg/dL Final   Creatinine, Ser 01/25/2021 0.56  0.44 - 1.00 mg/dL Final   Calcium 57/84/6962 9.4  8.9 - 10.3 mg/dL Final   Total Protein 95/28/4132 6.9  6.5 - 8.1 g/dL Final   Albumin 44/05/270 4.2  3.5 - 5.0 g/dL Final   AST 53/66/4403 26  15 - 41 U/L Final   ALT 01/25/2021 22  0 - 44 U/L Final   Alkaline Phosphatase 01/25/2021 58  38 - 126 U/L Final   Total Bilirubin 01/25/2021 0.7  0.3 - 1.2 mg/dL Final   GFR, Estimated 01/25/2021 >60  >60 mL/min Final   Comment: (NOTE) Calculated using the CKD-EPI Creatinine Equation (2021)    Anion gap 01/25/2021 7  5 - 15 Final   Performed at [facility], [address], [place], [place] [address]   Prothrombin Time 01/25/2021 13.1  11.4 - 15.2 seconds Final   INR 01/25/2021 1.0  0.8 - 1.2 Final   Comment: (NOTE) INR goal varies based on device and disease  states. Performed at [facility], [address] [place], [place] [address]    ABO/RH(D) 01/25/2021 A POS   Final   Antibody Screen 01/25/2021 POS   Final   Sample Expiration 01/25/2021 01/28/2021,2359   Final   Antibody Identification 01/25/2021 ANTI K   Final   PT AG Type 01/25/2021 NEGATIVE FOR KELL ANTIGEN   Final   Unit Number 01/25/2021 [id number]   Final   Blood Component Type 01/25/2021 RED CELLS,LR   Final   Unit division 01/25/2021 00   Final   Status of Unit 01/25/2021 REL FROM [facility]   Final   Transfusion Status 01/25/2021 OK TO TRANSFUSE   Final   Crossmatch Result 01/25/2021 COMPATIBLE   Final   Unit Number 01/25/2021 [id number]   Final   Blood Component Type 01/25/2021 RED CELLS,LR   Final   Unit division 01/25/2021 00   Final   Status of Unit 01/25/2021 REL FROM [facility]   Final   Transfusion Status 01/25/2021 OK TO TRANSFUSE   Final   Crossmatch Result 01/25/2021 COMPATIBLE   Final   Blood Product Unit Number 01/25/2021 [id number]   Final   PRODUCT CODE 01/25/2021 [id number]   Final   Unit Type and Rh 01/25/2021 6200   Final   Blood Product Expiration Date 01/25/2021 [id number]   Final   Blood Product Unit Number 01/25/2021 [id number]   Final   PRODUCT CODE 01/25/2021 [id number]   Final   Unit Type and Rh 01/25/2021 6200   Final   Blood Product Expiration Date 01/25/2021 [id number]   Final     X-Rays:DG Pelvis Portable  Result Date: 02/06/2021 CLINICAL DATA:  Post op right total hip replacement EXAM: PORTABLE PELVIS 1-2 VIEWS COMPARISON:  Intraoperative radiographs obtained earlier the same day FINDINGS:  The patient is status post bilateral total hip arthroplasty. Hardware alignment is within expected limits, without evidence of complication. Expected soft tissue gas is seen about the right hip. IMPRESSION: Postsurgical changes of likely right hip arthroplasty without evidence of complication. Electronically Signed   By: Lesia Hausen M.D.    On: 02/06/2021 09:41   DG C-Arm 1-60 Min-No Report  Result Date: 02/06/2021 Fluoroscopy was utilized by the requesting physician.  No radiographic interpretation.   MM 3D SCREEN BREAST BILATERAL  Result Date: 01/28/2021 CLINICAL DATA:  Screening. EXAM: DIGITAL SCREENING BILATERAL MAMMOGRAM WITH TOMOSYNTHESIS AND CAD TECHNIQUE: Bilateral screening digital craniocaudal and mediolateral oblique mammograms were obtained. Bilateral screening digital breast tomosynthesis was performed. The images were evaluated with computer-aided detection. COMPARISON:  Previous exam(s). ACR Breast Density Category b: There are scattered areas of fibroglandular density. FINDINGS: There are no findings suspicious for malignancy. IMPRESSION: No mammographic evidence of malignancy. A result letter of this screening mammogram will be mailed directly to the patient. RECOMMENDATION: Screening mammogram in one year. (Code:SM-B-01Y) BI-RADS CATEGORY  1: Negative. Electronically Signed   By: Annia Belt M.D.   On: 01/28/2021 15:40   DG HIP OPERATIVE UNILAT W OR W/O PELVIS RIGHT  Result Date: 02/06/2021 CLINICAL DATA:  Right anterior hip pain. EXAM: OPERATIVE RIGHT HIP (WITH PELVIS IF PERFORMED)  VIEWS TECHNIQUE: Fluoroscopic spot image(s) were submitted for interpretation post-operatively. COMPARISON:  None. FINDINGS: Multiple intraoperative fluoroscopic spot images are provided. Interval right total hip arthroplasty. IMPRESSION: Interval right total hip arthroplasty. Electronically Signed   By: Elige Ko M.D.   On: 02/06/2021 09:14    EKG: Orders placed or performed during the hospital encounter of 01/25/21   EKG 12 lead per protocol   EKG 12 lead per protocol     Hospital Course: Vanessa Heath is a 75 y.o. who was admitted to [facility]. They were brought to the operating room on 02/06/2021 and underwent Procedure(s): TOTAL HIP ARTHROPLASTY ANTERIOR APPROACH.  Patient tolerated the procedure well and was  later transferred to the recovery room and then to the orthopaedic floor for postoperative care. They were given PO and IV analgesics for pain control following their surgery. They were given 24 hours of postoperative antibiotics of  Anti-infectives (From admission, onward)    Start     Dose/Rate Route Frequency Ordered Stop   02/06/21 1400  ceFAZolin (ANCEF) IVPB 2g/100 mL premix        2 g 200 mL/hr over 30 Minutes Intravenous Every 6 hours 02/06/21 1316 02/06/21 2011   02/06/21 0530  ceFAZolin (ANCEF) 2-4 GM/100ML-% IVPB       Note to Pharmacy: Viviano Simas   : cabinet override      02/06/21 0530 02/06/21 0806      and started on DVT prophylaxis in the form of Aspirin and TED hose.   PT and OT were ordered for total joint protocol. Discharge planning consulted to help with postop disposition and equipment needs.  Patient had an uneventful night on the evening of surgery. They started to get up OOB with therapy on POD#1. Pt was seen during rounds and was ready to go home pending progress with therapy. She worked with therapy on POD #1 and was meeting goals. Pt was discharged to home later that day in stable condition.  Diet: Regular diet Activity: WBAT Follow-up: in two weeks Disposition: Home Discharged Condition: good   Discharge Instructions     Call MD / Call 911   Complete  by: As directed    If you experience chest pain or shortness of breath, CALL 911 and be transported to the hospital emergency room.  If you develope a fever above 101 F, pus (white drainage) or increased drainage or redness at the wound, or calf pain, call your surgeon's office.   Change dressing   Complete by: As directed    You have an adhesive waterproof bandage over the incision. Leave this in place until your first follow-up appointment. Once you remove this you will not need to place another bandage.   Constipation Prevention   Complete by: As directed    Drink plenty of fluids.  Prune juice may be  helpful.  You may use a stool softener, such as Colace (over the counter) 100 mg twice a day.  Use MiraLax (over the counter) for constipation as needed.   Diet - low sodium heart healthy   Complete by: As directed    Do not sit on low chairs, stoools or toilet seats, as it may be difficult to get up from low surfaces   Complete by: As directed    Driving restrictions   Complete by: As directed    No driving for two weeks   Post-operative opioid taper instructions:   Complete by: As directed    POST-OPERATIVE OPIOID TAPER INSTRUCTIONS: It is important to wean off of your opioid medication as soon as possible. If you do not need pain medication after your surgery it is ok to stop day one. Opioids include: Codeine, Hydrocodone(Norco, Vicodin), Oxycodone(Percocet, oxycontin) and hydromorphone amongst others.  Long term and even short term use of opiods can cause: Increased pain response Dependence Constipation Depression Respiratory depression And more.  Withdrawal symptoms can include Flu like symptoms Nausea, vomiting And more Techniques to manage these symptoms Hydrate well Eat regular healthy meals Stay active Use relaxation techniques(deep breathing, meditating, yoga) Do Not substitute Alcohol to help with tapering If you have been on opioids for less than two weeks and do not have pain than it is ok to stop all together.  Plan to wean off of opioids This plan should start within one week post op of your joint replacement. Maintain the same interval or time between taking each dose and first decrease the dose.  Cut the total daily intake of opioids by one tablet each day Next start to increase the time between doses. The last dose that should be eliminated is the evening dose.      TED hose   Complete by: As directed    Use stockings (TED hose) for three weeks on both leg(s).  You may remove them at night for sleeping.   Weight bearing as tolerated   Complete by: As  directed       Allergies as of 02/07/2021   No Known Allergies      Medication List     STOP taking these medications    acetaminophen 650 MG CR tablet Commonly known as: TYLENOL   diclofenac 75 MG EC tablet Commonly known as: VOLTAREN       TAKE these medications    amLODipine 2.5 MG tablet Commonly known as: NORVASC Take 2.5 mg by mouth daily.   aspirin 325 MG EC tablet Take 1 tablet (325 mg total) by mouth 2 (two) times daily. Then take one 81 mg aspirin once a day for three weeks. Then discontinue aspirin. Notes to patient: Blood clot prevention   buPROPion 150 MG 24 hr tablet Commonly known  as: WELLBUTRIN XL Take 150 mg by mouth every morning.   ezetimibe 10 MG tablet Commonly known as: ZETIA Take 10 mg by mouth daily.   gabapentin 300 MG capsule Commonly known as: NEURONTIN Take 600 mg by mouth at bedtime.   HYDROcodone-acetaminophen 5-325 MG tablet Commonly known as: NORCO/VICODIN Take 1-2 tablets by mouth every 6 (six) hours as needed for severe pain.   levothyroxine 125 MCG tablet Commonly known as: SYNTHROID Take 125 mcg by mouth daily before breakfast.   lisinopril 20 MG tablet Commonly known as: ZESTRIL Take 20 mg by mouth daily.   methocarbamol 500 MG tablet Commonly known as: ROBAXIN Take 1 tablet (500 mg total) by mouth every 6 (six) hours as needed for muscle spasms. Notes to patient: Muscle relaxer   simvastatin 20 MG tablet Commonly known as: ZOCOR Take 20 mg by mouth daily.   traMADol 50 MG tablet Commonly known as: ULTRAM Take 1-2 tablets (50-100 mg total) by mouth every 6 (six) hours as needed for moderate pain.               Discharge Care Instructions  (From admission, onward)           Start     Ordered   02/07/21 0000  Weight bearing as tolerated        02/07/21 0739   02/07/21 0000  Change dressing       Comments: You have an adhesive waterproof bandage over the incision. Leave this in place until your  first follow-up appointment. Once you remove this you will not need to place another bandage.   02/07/21 0739            Follow-up Information     Ollen Gross, MD. Schedule an appointment as soon as possible for a visit on 02/19/2021.   Specialty: Orthopedic Surgery Contact information: [address] [place] [address] [phone]                 Signed: Nelia Shi, MBA, PA-C Orthopedic Surgery 02/12/2021, 11:58 AM

## 2021-10-18 ENCOUNTER — Other Ambulatory Visit: Payer: Self-pay | Admitting: Family Medicine

## 2021-10-18 DIAGNOSIS — Z1231 Encounter for screening mammogram for malignant neoplasm of breast: Secondary | ICD-10-CM

## 2022-01-23 ENCOUNTER — Ambulatory Visit
Admission: RE | Admit: 2022-01-23 | Discharge: 2022-01-23 | Disposition: A | Discharge status: Home / Self Care | Payer: Medicare Other | Source: Ambulatory Visit | Attending: Family Medicine | Admitting: Family Medicine

## 2022-01-23 DIAGNOSIS — Z1231 Encounter for screening mammogram for malignant neoplasm of breast: Secondary | ICD-10-CM

## 2022-10-08 ENCOUNTER — Emergency Department (HOSPITAL_COMMUNITY)
Admission: EM | Admit: 2022-10-08 | Discharge: 2022-10-08 | Disposition: A | Discharge status: Home / Self Care | Payer: Medicare Other | Attending: Emergency Medicine | Admitting: Emergency Medicine

## 2022-10-08 ENCOUNTER — Other Ambulatory Visit: Payer: Self-pay

## 2022-10-08 ENCOUNTER — Encounter (HOSPITAL_COMMUNITY): Payer: Self-pay | Admitting: Radiology

## 2022-10-08 ENCOUNTER — Emergency Department (HOSPITAL_COMMUNITY): Payer: Medicare Other

## 2022-10-08 DIAGNOSIS — S0093XA Contusion of unspecified part of head, initial encounter: Secondary | ICD-10-CM

## 2022-10-08 DIAGNOSIS — W19XXXA Unspecified fall, initial encounter: Secondary | ICD-10-CM | POA: Insufficient documentation

## 2022-10-08 DIAGNOSIS — Z7982 Long term (current) use of aspirin: Secondary | ICD-10-CM | POA: Diagnosis not present

## 2022-10-08 DIAGNOSIS — E871 Hypo-osmolality and hyponatremia: Secondary | ICD-10-CM | POA: Insufficient documentation

## 2022-10-08 DIAGNOSIS — S0990XA Unspecified injury of head, initial encounter: Secondary | ICD-10-CM | POA: Diagnosis present

## 2022-10-08 DIAGNOSIS — I10 Essential (primary) hypertension: Secondary | ICD-10-CM | POA: Diagnosis not present

## 2022-10-08 DIAGNOSIS — Y92019 Unspecified place in single-family (private) house as the place of occurrence of the external cause: Secondary | ICD-10-CM | POA: Insufficient documentation

## 2022-10-08 DIAGNOSIS — R55 Syncope and collapse: Secondary | ICD-10-CM

## 2022-10-08 DIAGNOSIS — S0181XA Laceration without foreign body of other part of head, initial encounter: Secondary | ICD-10-CM

## 2022-10-08 DIAGNOSIS — Z79899 Other long term (current) drug therapy: Secondary | ICD-10-CM | POA: Insufficient documentation

## 2022-10-08 LAB — CBC WITH DIFFERENTIAL/PLATELET
Abs Immature Granulocytes: 0.07 10*3/uL (ref 0.00–0.07)
Basophils Absolute: 0.1 10*3/uL (ref 0.0–0.1)
Basophils Relative: 0 %
Eosinophils Absolute: 0.1 10*3/uL (ref 0.0–0.5)
Eosinophils Relative: 1 %
HCT: 40.8 % (ref 36.0–46.0)
Hemoglobin: 13.5 g/dL (ref 12.0–15.0)
Immature Granulocytes: 1 %
Lymphocytes Relative: 8 %
Lymphs Abs: 1 10*3/uL (ref 0.7–4.0)
MCH: 32.8 pg (ref 26.0–34.0)
MCHC: 33.1 g/dL (ref 30.0–36.0)
MCV: 99.3 fL (ref 80.0–100.0)
Monocytes Absolute: 0.8 10*3/uL (ref 0.1–1.0)
Monocytes Relative: 6 %
Neutro Abs: 10 10*3/uL — ABNORMAL HIGH (ref 1.7–7.7)
Neutrophils Relative %: 84 %
Platelets: 271 10*3/uL (ref 150–400)
RBC: 4.11 MIL/uL (ref 3.87–5.11)
RDW: 13 % (ref 11.5–15.5)
WBC: 11.9 10*3/uL — ABNORMAL HIGH (ref 4.0–10.5)
nRBC: 0 % (ref 0.0–0.2)

## 2022-10-08 LAB — LIPASE, BLOOD: Lipase: 41 U/L (ref 11–51)

## 2022-10-08 LAB — COMPREHENSIVE METABOLIC PANEL
ALT: 17 U/L (ref 0–44)
AST: 19 U/L (ref 15–41)
Albumin: 4.4 g/dL (ref 3.5–5.0)
Alkaline Phosphatase: 53 U/L (ref 38–126)
Anion gap: 10 (ref 5–15)
BUN: 11 mg/dL (ref 8–23)
CO2: 24 mmol/L (ref 22–32)
Calcium: 9.2 mg/dL (ref 8.9–10.3)
Chloride: 96 mmol/L — ABNORMAL LOW (ref 98–111)
Creatinine, Ser: 0.69 mg/dL (ref 0.44–1.00)
GFR, Estimated: >60 mL/min (ref >60)
Glucose, Bld: 136 mg/dL — ABNORMAL HIGH (ref 70–99)
Potassium: 3.9 mmol/L (ref 3.5–5.1)
Sodium: 130 mmol/L — ABNORMAL LOW (ref 135–145)
Total Bilirubin: 0.6 mg/dL (ref 0.3–1.2)
Total Protein: 7.3 g/dL (ref 6.5–8.1)

## 2022-10-08 MED ORDER — BACITRACIN ZINC 500 UNIT/GM EX OINT
TOPICAL_OINTMENT | Freq: Two times a day (BID) | CUTANEOUS | Status: DC
  Administered 2022-10-08: 1 via TOPICAL
  Filled 2022-10-08: qty 0.9

## 2022-10-08 MED ORDER — LIDOCAINE-EPINEPHRINE (PF) 2 %-1:200000 IJ SOLN
20.0000 mL | Freq: Once | INTRAMUSCULAR | Status: AC
  Administered 2022-10-08: 20 mL
  Filled 2022-10-08: qty 20

## 2022-10-08 MED ORDER — ACETAMINOPHEN 325 MG PO TABS
650.0000 mg | ORAL_TABLET | Freq: Once | ORAL | Status: AC
  Administered 2022-10-08: 650 mg via ORAL
  Filled 2022-10-08: qty 2

## 2022-10-08 NOTE — ED Triage Notes (Signed)
Pt presents after having a fall this morning at home.  Pt states she was out in the heat talking to a neighbor, went inside as it was getting too hot, and may have "passed out" but fell to the floor forward hitting her right forehead/eye.  Pt's husband helped her up and states she had one episode of vomiting.  Pt reports the vomiting may be more related to the fact that she took her weekly dose of methotrexate this morning and it has that side effect for her each week.  Pt is alert and oriented, no vision changes, remembers events.  No blood thinners.

## 2022-10-08 NOTE — Discharge Instructions (Signed)
It was our pleasure to provide your ER care today - we hope that you feel better.  Make sure to drink plenty of fluids/stay well hydrated. If out in heat, try drinking electrolyte containing fluids such as gatorade or powerade.   From today's labs, your sodium level is mildly low - get adequate electrolytes/salt in diet and fluids, and follow up with primary care doctor in the next couple weeks. Also follow up with primary care doctor for your heart pressure that is mildly high today.   Have sutures removed, your doctor or urgent care, in 7-9 days.   Return to ER if worse, new symptoms, new/severe pain, fevers, recurrent weakness or faintness, chest pain, trouble breathing, infection of wound, or other concern.

## 2022-10-08 NOTE — ED Provider Notes (Signed)
[place] [facility]MENT AT [facility] Provider Note   CSN: [id number] Arrival date & time: 10/08/22  1342     History {Add pertinent medical, surgical, social history, OB history to HPI:1} Chief Complaint  Patient presents with   Fall   Head Injury    Vanessa Heath is a 77 y.o. female.  Patient with fall at home today. Indicates was out in heat, and had eaten/drank relatively little. Also states had taken her normal methotrexate dose and normally that will make her nauseated for a day or so.  Was standing, felt faint, nauseated, and with very brief syncopal event. Hit head/lac to forehead. Tetanus up to date. No associated chest pain or discomfort. No sob or unusual doe. No palpitations. No headache prior to fall/fainting. Post fall, lac to forehead. Denies other pain or injury. No severe headache. No midline neck or back pain. No chest pain. No abd pain. No dysuria or gu c/o. No extremity pain or injury. No anticoag use.   The history is provided by the patient, medical records and the spouse.  Fall Pertinent negatives include no chest pain, no abdominal pain and no shortness of breath.  Head Injury Associated symptoms: no neck pain, no numbness and no vomiting        Home Medications Prior to Admission medications   Medication Sig Start Date End Date Taking? Authorizing Provider  amLODipine (NORVASC) 2.5 MG tablet Take 2.5 mg by mouth daily.    [provider]  aspirin EC 325 MG EC tablet Take 1 tablet (325 mg total) by mouth 2 (two) times daily. Then take one 81 mg aspirin once a day for three weeks. Then discontinue aspirin. 02/07/21   Nelia Shi D, PA-C  buPROPion (WELLBUTRIN XL) 150 MG 24 hr tablet Take 150 mg by mouth every morning. 11/13/20   [provider]  ezetimibe (ZETIA) 10 MG tablet Take 10 mg by mouth daily. 02/15/17   [provider]  gabapentin (NEURONTIN) 300 MG capsule Take 600 mg by mouth at bedtime.  02/23/17   [provider]  HYDROcodone-acetaminophen (NORCO/VICODIN) 5-325 MG tablet Take 1-2 tablets by mouth every 6 (six) hours as needed for severe pain. 02/07/21   Alyssa Grove, PA-C  levothyroxine (SYNTHROID) 125 MCG tablet Take 125 mcg by mouth daily before breakfast.    [provider]  lisinopril (PRINIVIL,ZESTRIL) 20 MG tablet Take 20 mg by mouth daily.    [provider]  methocarbamol (ROBAXIN) 500 MG tablet Take 1 tablet (500 mg total) by mouth every 6 (six) hours as needed for muscle spasms. 02/07/21   Nelia Shi D, PA-C  simvastatin (ZOCOR) 20 MG tablet Take 20 mg by mouth daily. 02/15/17   [provider]  traMADol (ULTRAM) 50 MG tablet Take 1-2 tablets (50-100 mg total) by mouth every 6 (six) hours as needed for moderate pain. 02/07/21   Nelia Shi D, PA-C      Allergies    Patient has no known allergies.    Review of Systems   Review of Systems  Constitutional:  Negative for fever.  HENT:  Negative for nosebleeds.   Eyes:  Negative for pain, redness and visual disturbance.  Respiratory:  Negative for cough and shortness of breath.   Cardiovascular:  Negative for chest pain, palpitations and leg swelling.  Gastrointestinal:  Negative for abdominal pain, blood in stool, diarrhea and vomiting.  Genitourinary:  Negative for dysuria and flank pain.  Musculoskeletal:  Negative for back  pain and neck pain.  Skin:  Positive for wound.  Neurological:  Negative for speech difficulty, weakness and numbness.  Hematological:  Does not bruise/bleed easily.  Psychiatric/Behavioral:  Negative for confusion.     Physical Exam Updated Vital Signs BP (!) 140/93   Pulse 73   Temp 97.8 F (36.6 C) (Oral)   Resp 16   SpO2 99%  Physical Exam Vitals and nursing note reviewed.  Constitutional:      Appearance: Normal appearance. She is well-developed.  HENT:     Head:     Comments: 4 cm lac to right forehead. No fb seen or felt.      Nose: Nose normal.     Mouth/Throat:     Mouth: Mucous membranes are moist.  Eyes:     General: No scleral icterus.    Extraocular Movements: Extraocular movements intact.     Conjunctiva/sclera: Conjunctivae normal.     Pupils: Pupils are equal, round, and reactive to light.  Neck:     Vascular: No carotid bruit.     Trachea: No tracheal deviation.  Cardiovascular:     Rate and Rhythm: Normal rate and regular rhythm.     Pulses: Normal pulses.     Heart sounds: Normal heart sounds. No murmur heard.    No friction rub. No gallop.  Pulmonary:     Effort: Pulmonary effort is normal. No respiratory distress.     Breath sounds: Normal breath sounds.  Chest:     Chest wall: No tenderness.  Abdominal:     General: Bowel sounds are normal. There is no distension.     Palpations: Abdomen is soft. There is no mass.     Tenderness: There is no abdominal tenderness.  Genitourinary:    Comments: No cva tenderness.  Musculoskeletal:        General: No swelling.     Cervical back: Normal range of motion and neck supple. No rigidity or tenderness. No muscular tenderness.     Comments: CTLS spine, non tender, aligned, no step off. Good rom bil extremities without pain or focal bony tenderness.   Skin:    General: Skin is warm and dry.     Findings: No rash.  Neurological:     Mental Status: She is alert.     Comments: Alert, speech normal. GCS 15. Motor/sens grossly intact bil.   Psychiatric:        Mood and Affect: Mood normal.     ED Results / Procedures / Treatments   Labs (all labs ordered are listed, but only abnormal results are displayed) Results for orders placed or performed during the hospital encounter of 10/08/22  CBC with Differential  Result Value Ref Range   WBC 11.9 (H) 4.0 - 10.5 K/uL   RBC 4.11 3.87 - 5.11 MIL/uL   Hemoglobin 13.5 12.0 - 15.0 g/dL   HCT 16.1 09.6 - 04.5 %   MCV 99.3 80.0 - 100.0 fL   MCH 32.8 26.0 - 34.0 pg   MCHC 33.1 30.0 - 36.0 g/dL   RDW  40.9 81.1 - 91.4 %   Platelets 271 150 - 400 K/uL   nRBC 0.0 0.0 - 0.2 %   Neutrophils Relative % 84 %   Neutro Abs 10.0 (H) 1.7 - 7.7 K/uL   Lymphocytes Relative 8 %   Lymphs Abs 1.0 0.7 - 4.0 K/uL   Monocytes Relative 6 %   Monocytes Absolute 0.8 0.1 - 1.0 K/uL   Eosinophils Relative 1 %  Eosinophils Absolute 0.1 0.0 - 0.5 K/uL   Basophils Relative 0 %   Basophils Absolute 0.1 0.0 - 0.1 K/uL   Immature Granulocytes 1 %   Abs Immature Granulocytes 0.07 0.00 - 0.07 K/uL  Comprehensive metabolic panel  Result Value Ref Range   Sodium 130 (L) 135 - 145 mmol/L   Potassium 3.9 3.5 - 5.1 mmol/L   Chloride 96 (L) 98 - 111 mmol/L   CO2 24 22 - 32 mmol/L   Glucose, Bld 136 (H) 70 - 99 mg/dL   BUN 11 8 - 23 mg/dL   Creatinine, Ser 4.78 0.44 - 1.00 mg/dL   Calcium 9.2 8.9 - 29.5 mg/dL   Total Protein 7.3 6.5 - 8.1 g/dL   Albumin 4.4 3.5 - 5.0 g/dL   AST 19 15 - 41 U/L   ALT 17 0 - 44 U/L   Alkaline Phosphatase 53 38 - 126 U/L   Total Bilirubin 0.6 0.3 - 1.2 mg/dL   GFR, Estimated >62 >13 mL/min   Anion gap 10 5 - 15  Lipase, blood  Result Value Ref Range   Lipase 41 11 - 51 U/L   CT Head Wo Contrast  Result Date: 10/08/2022 CLINICAL DATA:  Trip and fall, facial injury EXAM: CT HEAD WITHOUT CONTRAST CT MAXILLOFACIAL WITHOUT CONTRAST CT CERVICAL SPINE WITHOUT CONTRAST TECHNIQUE: Multidetector CT imaging of the head, cervical spine, and maxillofacial structures were performed using the standard protocol without intravenous contrast. Multiplanar CT image reconstructions of the cervical spine and maxillofacial structures were also generated. RADIATION DOSE REDUCTION: This exam was performed according to the departmental dose-optimization program which includes automated exposure control, adjustment of the mA and/or kV according to patient size and/or use of iterative reconstruction technique. COMPARISON:  04/17/2017 FINDINGS: CT HEAD FINDINGS Brain: No evidence of acute infarction,  hemorrhage, hydrocephalus, extra-axial collection or mass lesion/mass effect. Periventricular white matter hypodensity. Vascular: No hyperdense vessel or unexpected calcification. CT FACIAL BONES FINDINGS Skull: Normal. Negative for fracture or focal lesion. Facial bones: No displaced fractures or dislocations. Sinuses/Orbits: No acute finding. Other: Soft tissue laceration of the right forehead (series 4, image 40). Superficial soft tissue contusion over the right orbit (series 3, image 56). 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 CT CERVICAL SPINE FINDINGS Alignment: Degenerative straightening of the normal cervical lordosis. Skull base and vertebrae: No acute fracture. No primary bone lesion or focal pathologic process. Soft tissues and spinal canal: No prevertebral fluid or swelling. No visible canal hematoma. Disc levels: Severe multilevel disc space height loss and osteophytosis from C4-C7. Upper chest: Negative. Other: None. IMPRESSION: 1. No acute intracranial pathology. Small-vessel white matter disease. 2. No displaced fracture or dislocation of the facial bones. 3. Soft tissue laceration of the right forehead. Superficial soft tissue contusion over the right orbit. 4. No fracture or static subluxation of the cervical spine. 5. 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 Electronically Signed   By: Jearld Lesch M.D.   On: 10/08/2022 15:25   CT Cervical Spine Wo Contrast  Result Date: 10/08/2022 CLINICAL DATA:  Trip and fall, facial injury EXAM: CT HEAD WITHOUT CONTRAST CT MAXILLOFACIAL WITHOUT CONTRAST CT CERVICAL SPINE WITHOUT CONTRAST TECHNIQUE: Multidetector CT imaging of the head, cervical spine, and maxillofacial structures were performed using the standard protocol without intravenous contrast. Multiplanar CT image reconstructions of the cervical spine and maxillofacial structures were also generated. RADIATION DOSE  REDUCTION: This exam was performed according to the departmental dose-optimization program  which includes automated exposure control, adjustment of the mA and/or kV according to patient size and/or use of iterative reconstruction technique. COMPARISON:  04/17/2017 FINDINGS: CT HEAD FINDINGS Brain: No evidence of acute infarction, hemorrhage, hydrocephalus, extra-axial collection or mass lesion/mass effect. Periventricular white matter hypodensity. Vascular: No hyperdense vessel or unexpected calcification. CT FACIAL BONES FINDINGS Skull: Normal. Negative for fracture or focal lesion. Facial bones: No displaced fractures or dislocations. Sinuses/Orbits: No acute finding. Other: Soft tissue laceration of the right forehead (series 4, image 40). Superficial soft tissue contusion over the right orbit (series 3, image 56). 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 CT CERVICAL SPINE FINDINGS Alignment: Degenerative straightening of the normal cervical lordosis. Skull base and vertebrae: No acute fracture. No primary bone lesion or focal pathologic process. Soft tissues and spinal canal: No prevertebral fluid or swelling. No visible canal hematoma. Disc levels: Severe multilevel disc space height loss and osteophytosis from C4-C7. Upper chest: Negative. Other: None. IMPRESSION: 1. No acute intracranial pathology. Small-vessel white matter disease. 2. No displaced fracture or dislocation of the facial bones. 3. Soft tissue laceration of the right forehead. Superficial soft tissue contusion over the right orbit. 4. No fracture or static subluxation of the cervical spine. 5. 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 Electronically Signed   By: Jearld Lesch M.D.   On: 10/08/2022 15:25   CT Maxillofacial Wo Contrast  Result Date: 10/08/2022 CLINICAL DATA:  Trip and fall, facial injury EXAM: CT HEAD WITHOUT CONTRAST CT MAXILLOFACIAL WITHOUT CONTRAST CT  CERVICAL SPINE WITHOUT CONTRAST TECHNIQUE: Multidetector CT imaging of the head, cervical spine, and maxillofacial structures were performed using the standard protocol without intravenous contrast. Multiplanar CT image reconstructions of the cervical spine and maxillofacial structures were also generated. RADIATION DOSE REDUCTION: This exam was performed according to the departmental dose-optimization program which includes automated exposure control, adjustment of the mA and/or kV according to patient size and/or use of iterative reconstruction technique. COMPARISON:  04/17/2017 FINDINGS: CT HEAD FINDINGS Brain: No evidence of acute infarction, hemorrhage, hydrocephalus, extra-axial collection or mass lesion/mass effect. Periventricular white matter hypodensity. Vascular: No hyperdense vessel or unexpected calcification. CT FACIAL BONES FINDINGS Skull: Normal. Negative for fracture or focal lesion. Facial bones: No displaced fractures or dislocations. Sinuses/Orbits: No acute finding. Other: Soft tissue laceration of the right forehead (series 4, image 40). Superficial soft tissue contusion over the right orbit (series 3, image 56). 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 CT CERVICAL SPINE FINDINGS Alignment: Degenerative straightening of the normal cervical lordosis. Skull base and vertebrae: No acute fracture. No primary bone lesion or focal pathologic process. Soft tissues and spinal canal: No prevertebral fluid or swelling. No visible canal hematoma. Disc levels: Severe multilevel disc space height loss and osteophytosis from C4-C7. Upper chest: Negative. Other: None. IMPRESSION: 1. No acute intracranial pathology. Small-vessel white matter disease. 2. No displaced fracture or dislocation of the facial bones. 3. Soft tissue laceration of the right forehead. Superficial soft tissue contusion over the right orbit. 4. No fracture or static subluxation of the  cervical spine. 5. 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 Electronically Signed   By: Jearld Lesch M.D.   On: 10/08/2022 15:25    EKG EKG Interpretation  Date/Time:  Wednesday October 08 2022 15:18:59 EDT Ventricular Rate:  64 PR Interval:  167 QRS Duration: 103 QT Interval:  425 QTC Calculation: 439 R Axis:   -15 Text Interpretation:  Sinus rhythm No previous tracing Confirmed by Cathren Laine ([id number]) on 10/08/2022 3:30:50 PM  Radiology CT Head Wo Contrast  Result Date: 10/08/2022 CLINICAL DATA:  Trip and fall, facial injury EXAM: CT HEAD WITHOUT CONTRAST CT MAXILLOFACIAL WITHOUT CONTRAST CT CERVICAL SPINE WITHOUT CONTRAST TECHNIQUE: Multidetector CT imaging of the head, cervical spine, and maxillofacial structures were performed using the standard protocol without intravenous contrast. Multiplanar CT image reconstructions of the cervical spine and maxillofacial structures were also generated. RADIATION DOSE REDUCTION: This exam was performed according to the departmental dose-optimization program which includes automated exposure control, adjustment of the mA and/or kV according to patient size and/or use of iterative reconstruction technique. COMPARISON:  04/17/2017 FINDINGS: CT HEAD FINDINGS Brain: No evidence of acute infarction, hemorrhage, hydrocephalus, extra-axial collection or mass lesion/mass effect. Periventricular white matter hypodensity. Vascular: No hyperdense vessel or unexpected calcification. CT FACIAL BONES FINDINGS Skull: Normal. Negative for fracture or focal lesion. Facial bones: No displaced fractures or dislocations. Sinuses/Orbits: No acute finding. Other: Soft tissue laceration of the right forehead (series 4, image 40). Superficial soft tissue contusion over the right orbit (series 3, image 56). 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 CT CERVICAL SPINE FINDINGS Alignment: Degenerative straightening of  the normal cervical lordosis. Skull base and vertebrae: No acute fracture. No primary bone lesion or focal pathologic process. Soft tissues and spinal canal: No prevertebral fluid or swelling. No visible canal hematoma. Disc levels: Severe multilevel disc space height loss and osteophytosis from C4-C7. Upper chest: Negative. Other: None. IMPRESSION: 1. No acute intracranial pathology. Small-vessel white matter disease. 2. No displaced fracture or dislocation of the facial bones. 3. Soft tissue laceration of the right forehead. Superficial soft tissue contusion over the right orbit. 4. No fracture or static subluxation of the cervical spine. 5. 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 Electronically Signed   By: Jearld Lesch M.D.   On: 10/08/2022 15:25   CT Cervical Spine Wo Contrast  Result Date: 10/08/2022 CLINICAL DATA:  Trip and fall, facial injury EXAM: CT HEAD WITHOUT CONTRAST CT MAXILLOFACIAL WITHOUT CONTRAST CT CERVICAL SPINE WITHOUT CONTRAST TECHNIQUE: Multidetector CT imaging of the head, cervical spine, and maxillofacial structures were performed using the standard protocol without intravenous contrast. Multiplanar CT image reconstructions of the cervical spine and maxillofacial structures were also generated. RADIATION DOSE REDUCTION: This exam was performed according to the departmental dose-optimization program which includes automated exposure control, adjustment of the mA and/or kV according to patient size and/or use of iterative reconstruction technique. COMPARISON:  04/17/2017 FINDINGS: CT HEAD FINDINGS Brain: No evidence of acute infarction, hemorrhage, hydrocephalus, extra-axial collection or mass lesion/mass effect. Periventricular white matter hypodensity. Vascular: No hyperdense vessel or unexpected calcification. CT FACIAL BONES FINDINGS Skull: Normal. Negative for fracture or focal lesion. Facial bones: No displaced fractures or dislocations. Sinuses/Orbits: No  acute finding. Other: Soft tissue laceration of the right forehead (series 4, image 40). Superficial soft tissue contusion over the right orbit (series 3, image 56). 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 CT CERVICAL SPINE FINDINGS Alignment: Degenerative straightening of the normal cervical lordosis. Skull base and vertebrae: No acute fracture. No primary bone lesion or focal pathologic process. Soft tissues and spinal canal: No prevertebral fluid or swelling. No visible canal hematoma. Disc levels: Severe multilevel disc space height loss and osteophytosis from C4-C7. Upper chest: Negative. Other: None. IMPRESSION: 1. No acute intracranial pathology. Small-vessel white matter disease. 2. No displaced fracture  or dislocation of the facial bones. 3. Soft tissue laceration of the right forehead. Superficial soft tissue contusion over the right orbit. 4. No fracture or static subluxation of the cervical spine. 5. 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 Electronically Signed   By: Jearld Lesch M.D.   On: 10/08/2022 15:25   CT Maxillofacial Wo Contrast  Result Date: 10/08/2022 CLINICAL DATA:  Trip and fall, facial injury EXAM: CT HEAD WITHOUT CONTRAST CT MAXILLOFACIAL WITHOUT CONTRAST CT CERVICAL SPINE WITHOUT CONTRAST TECHNIQUE: Multidetector CT imaging of the head, cervical spine, and maxillofacial structures were performed using the standard protocol without intravenous contrast. Multiplanar CT image reconstructions of the cervical spine and maxillofacial structures were also generated. RADIATION DOSE REDUCTION: This exam was performed according to the departmental dose-optimization program which includes automated exposure control, adjustment of the mA and/or kV according to patient size and/or use of iterative reconstruction technique. COMPARISON:  04/17/2017 FINDINGS: CT HEAD FINDINGS Brain: No evidence of acute infarction,  hemorrhage, hydrocephalus, extra-axial collection or mass lesion/mass effect. Periventricular white matter hypodensity. Vascular: No hyperdense vessel or unexpected calcification. CT FACIAL BONES FINDINGS Skull: Normal. Negative for fracture or focal lesion. Facial bones: No displaced fractures or dislocations. Sinuses/Orbits: No acute finding. Other: Soft tissue laceration of the right forehead (series 4, image 40). Superficial soft tissue contusion over the right orbit (series 3, image 56). 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 CT CERVICAL SPINE FINDINGS Alignment: Degenerative straightening of the normal cervical lordosis. Skull base and vertebrae: No acute fracture. No primary bone lesion or focal pathologic process. Soft tissues and spinal canal: No prevertebral fluid or swelling. No visible canal hematoma. Disc levels: Severe multilevel disc space height loss and osteophytosis from C4-C7. Upper chest: Negative. Other: None. IMPRESSION: 1. No acute intracranial pathology. Small-vessel white matter disease. 2. No displaced fracture or dislocation of the facial bones. 3. Soft tissue laceration of the right forehead. Superficial soft tissue contusion over the right orbit. 4. No fracture or static subluxation of the cervical spine. 5. 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 Electronically Signed   By: Jearld Lesch M.D.   On: 10/08/2022 15:25    Procedures .Marland KitchenLaceration Repair  Date/Time: 10/08/2022 4:49 PM  Performed by: Cathren Laine, MD Authorized by: Cathren Laine, MD   Consent:    Consent given by:  Patient Anesthesia:    Anesthesia method:  Local infiltration   Local anesthetic:  Lidocaine 2% WITH epi Laceration details:    Location:  Face   Face location:  Forehead   Length (cm):  4 Pre-procedure details:    Preparation:  Patient was prepped and draped in usual sterile fashion and imaging obtained to evaluate for  foreign bodies Exploration:    Wound exploration: entire depth of wound visualized     Contaminated: no   Treatment:    Area cleansed with:  Saline   Amount of cleaning: moderate.   Irrigation solution:  Sterile saline   Debridement:  Minimal   Layers/structures repaired:  Deep subcutaneous Deep subcutaneous:    Suture size:  5-0   Suture material:  Vicryl   Suture technique:  Simple interrupted   Number of sutures:  4 Skin repair:    Repair method:  Sutures   Suture size:  6-0   Suture material:  Prolene (vicryl)   Suture technique:  Simple interrupted   Number of sutures:  9 Approximation:    Approximation:  Close Repair type:  Repair type:  Intermediate Post-procedure details:    Dressing:  Antibiotic ointment   Procedure completion:  Tolerated well, no immediate complications   {Document cardiac monitor, telemetry assessment procedure when appropriate:1}  Medications Ordered in ED Medications  bacitracin ointment (has no administration in time range)  acetaminophen (TYLENOL) tablet 650 mg (has no administration in time range)  lidocaine-EPINEPHrine (XYLOCAINE W/EPI) 2 %-1:200000 (PF) injection 20 mL (20 mLs Infiltration Given by Other 10/08/22 1644)    ED Course/ Medical Decision Making/ A&P   {   Click here for ABCD2, HEART and other calculatorsREFRESH Note before signing :1}                          Medical Decision Making Amount and/or Complexity of Data Reviewed Labs: ordered. ECG/medicine tests: ordered.  Risk OTC drugs. Prescription drug management.   Iv ns. Continuous pulse ox and cardiac monitoring. Labs ordered/sent. Imaging ordered.   Differential diagnosis includes head injury, sdh, fainting, anemia, dehydration, etc. Dispo decision including potential need for admission considered - will get labs and imaging and reassess.   Reviewed nursing notes and prior charts for additional history. External reports reviewed. Additional history from:  spouse.   Cardiac monitor: sinus rhythm, rate 74.  Labs reviewed/interpreted by me - hgb normal. Na mildly low.   CT reviewed/interpreted by me - no hem or fx.   Wound sutured.   Po fluids. Ambulate in hall. No faintness.   Pt currently appears stable for d/c.   Rec close pcp f/u.  Return precautions provided.    {Document critical care time when appropriate:1} {Document review of labs and clinical decision tools ie heart score, Chads2Vasc2 etc:1}  {Document your independent review of radiology images, and any outside records:1} {Document your discussion with family members, caretakers, and with consultants:1} {Document social determinants of health affecting pt's care:1} {Document your decision making why or why not admission, treatments were needed:1} Final Clinical Impression(s) / ED Diagnoses Final diagnoses:  None    Rx / DC Orders ED Discharge Orders     None

## 2022-12-30 ENCOUNTER — Other Ambulatory Visit: Payer: Self-pay | Admitting: Family Medicine

## 2022-12-30 DIAGNOSIS — Z1231 Encounter for screening mammogram for malignant neoplasm of breast: Secondary | ICD-10-CM

## 2023-01-27 ENCOUNTER — Ambulatory Visit
Admission: RE | Admit: 2023-01-27 | Discharge: 2023-01-27 | Disposition: A | Discharge status: Home / Self Care | Payer: Medicare Other | Source: Ambulatory Visit | Attending: Family Medicine | Admitting: Family Medicine

## 2023-01-27 DIAGNOSIS — Z1231 Encounter for screening mammogram for malignant neoplasm of breast: Secondary | ICD-10-CM

## 2023-06-16 ENCOUNTER — Other Ambulatory Visit: Payer: Self-pay | Admitting: Family Medicine

## 2023-06-16 DIAGNOSIS — R413 Other amnesia: Secondary | ICD-10-CM

## 2023-06-16 DIAGNOSIS — E2839 Other primary ovarian failure: Secondary | ICD-10-CM

## 2023-06-18 ENCOUNTER — Encounter: Payer: Self-pay | Admitting: Family Medicine

## 2023-06-29 ENCOUNTER — Ambulatory Visit
Admission: RE | Admit: 2023-06-29 | Discharge: 2023-06-29 | Disposition: A | Discharge status: Home / Self Care | Payer: Medicare Other | Source: Ambulatory Visit | Attending: Family Medicine | Admitting: Family Medicine

## 2023-06-29 DIAGNOSIS — R413 Other amnesia: Secondary | ICD-10-CM

## 2023-09-23 ENCOUNTER — Encounter: Payer: Self-pay | Admitting: Neurology

## 2023-09-23 DIAGNOSIS — I10 Essential (primary) hypertension: Secondary | ICD-10-CM | POA: Insufficient documentation

## 2023-09-23 DIAGNOSIS — E7800 Pure hypercholesterolemia, unspecified: Secondary | ICD-10-CM | POA: Insufficient documentation

## 2023-09-24 ENCOUNTER — Ambulatory Visit: Admitting: Neurology

## 2023-09-24 ENCOUNTER — Encounter: Payer: Self-pay | Admitting: Neurology

## 2023-09-24 VITALS — BP 138/74 | HR 85 | Ht 60.0 in | Wt 147.0 lb

## 2023-09-24 DIAGNOSIS — E7800 Pure hypercholesterolemia, unspecified: Secondary | ICD-10-CM

## 2023-09-24 DIAGNOSIS — I10 Essential (primary) hypertension: Secondary | ICD-10-CM

## 2023-09-24 DIAGNOSIS — G3184 Mild cognitive impairment, so stated: Secondary | ICD-10-CM

## 2023-09-24 DIAGNOSIS — E538 Deficiency of other specified B group vitamins: Secondary | ICD-10-CM

## 2023-09-24 NOTE — Progress Notes (Unsigned)
 [facility]  PATIENT: Vanessa Heath DOB: [birth year]  REQUESTING CLINICIAN: Faustina Hood, MD HISTORY FROM: Patient/Husband  REASON FOR VISIT: Memory loss    HISTORICAL  CHIEF COMPLAINT:  Chief Complaint  Patient presents with   New Patient (Initial Visit)    Rm 13. MMSE NP Paper referral for Memory issues. MMSE score: 23    HISTORY OF PRESENT ILLNESS:  This is 78 year old woman past medical history of hypertension, hypothyroidism, rheumatoid arthritis who is presenting with memory problem.  Patient tells me that she noted that she forgetful, sometimes relying on her husband.  She is independent in all actives of daily living, still drives, but only short distance with the use of GPS. Husband tells me that patient memory loss has been going on for many years, he noticed it during COVID time but it has been getting worse in the past 2 years.  Husband noted the patient is repetitive, asks the same questions, she does have a poor short-term memory.  She is not interested in things that she enjoyed in the past.  Does not stay connected with friends.  She does have difficulty figuring things out, it takes her more time to process information.  She is not exercising anymore.  Husband is also mentioning that her children's are concerned.  She has a sister with dementia. She is already on Memantine, could not tolerate Aricept.     TBI: Yes, from multiple falls  Stroke: no past history of stroke Seizures:  no past history of seizures Sleep:  no history of sleep apnea.  Mood:  patient denies anxiety and depression Family history of Dementia: Sister with dementia   Functional status: independent in all ADLs and IADLs Patient lives with husband. Cooking: not much  Cleaning: yes Shopping: yes Bathing: no problem Toileting: no problem Driving: yes, with the GPS  Bills: yes Medications: yes Ever left the stove on by accident?: denies Forget how to use items  around the house?: denies Getting lost going to familiar places?: denies Forgetting loved ones names?: denies Word finding difficulty? Yes  Sleep: Good    OTHER MEDICAL CONDITIONS: Hypertension, Rheumatoid arthritis, Hypothryoidism    REVIEW OF SYSTEMS: Full 14 system review of systems performed and negative with exception of: As noted in the HPI  ALLERGIES: No Known Allergies  HOME MEDICATIONS: Outpatient Medications Prior to Visit  Medication Sig Dispense Refill   acetaminophen  (TYLENOL ) 650 MG CR tablet Take 650 mg by mouth every 8 (eight) hours as needed for pain.     amLODipine  (NORVASC ) 2.5 MG tablet Take 2.5 mg by mouth daily.     ezetimibe  (ZETIA ) 10 MG tablet Take 10 mg by mouth daily.  0   folic acid (FOLVITE) 1 MG tablet Take 1 mg by mouth daily.     gabapentin  (NEURONTIN ) 300 MG capsule Take 600 mg by mouth at bedtime.  0   hydroxychloroquine (PLAQUENIL) 200 MG tablet Take 200 mg by mouth daily.     levothyroxine  (SYNTHROID ) 125 MCG tablet Take 125 mcg by mouth daily before breakfast.     lisinopril (PRINIVIL,ZESTRIL) 20 MG tablet Take 20 mg by mouth daily.     memantine (NAMENDA) 5 MG tablet Take 7 mg by mouth daily.     Methotrexate Sodium (METHOTREXATE, PF,) 50 MG/2ML injection Inject into the vein. Patient and husband cannot remember if the dose is 25 mg or 50 mg.     simvastatin  (ZOCOR ) 20 MG tablet Take 20 mg by mouth daily.  0   aspirin  EC 325 MG EC tablet Take 1 tablet (325 mg total) by mouth 2 (two) times daily. Then take one 81 mg aspirin  once a day for three weeks. Then discontinue aspirin . (Patient not taking: Reported on 09/24/2023) 42 tablet 0   buPROPion  (WELLBUTRIN  XL) 150 MG 24 hr tablet Take 150 mg by mouth every morning. (Patient not taking: Reported on 09/24/2023)     diclofenac (VOLTAREN) 75 MG EC tablet Take 75 mg by mouth 2 (two) times daily. (Patient not taking: Reported on 09/24/2023)     HYDROcodone -acetaminophen  (NORCO/VICODIN) 5-325 MG tablet Take  1-2 tablets by mouth every 6 (six) hours as needed for severe pain. (Patient not taking: Reported on 09/24/2023) 42 tablet 0   methocarbamol  (ROBAXIN ) 500 MG tablet Take 1 tablet (500 mg total) by mouth every 6 (six) hours as needed for muscle spasms. (Patient not taking: Reported on 09/24/2023) 40 tablet 0   methotrexate (RHEUMATREX) 15 MG tablet Take 50 mg/m2/day by mouth once a week. Caution: Chemotherapy. Protect from light. (Patient not taking: Reported on 09/24/2023)     traMADol  (ULTRAM ) 50 MG tablet Take 1-2 tablets (50-100 mg total) by mouth every 6 (six) hours as needed for moderate pain. (Patient not taking: Reported on 09/24/2023) 40 tablet 0   No facility-administered medications prior to visit.    PAST MEDICAL HISTORY: Past Medical History:  Diagnosis Date   Actinic keratosis    Arthritis    Attention deficit disorder (ADD) in adult    Basal cell carcinoma    Elevated LDH    Hypertension    Hypothyroidism     PAST SURGICAL HISTORY: Past Surgical History:  Procedure Laterality Date   EYE SURGERY Bilateral 2022   REPLACEMENT TOTAL HIP W/  RESURFACING IMPLANTS     skin cancer removal     TOTAL HIP ARTHROPLASTY N/A 02/06/2021   Procedure: TOTAL HIP ARTHROPLASTY ANTERIOR APPROACH;  Surgeon: Liliane Rei, MD;  Location: [facility];  Service: Orthopedics;  Laterality: N/A;    FAMILY HISTORY: Family History  Problem Relation Age of Onset   Breast cancer Mother    Breast cancer Sister    Breast cancer Maternal Grandmother     SOCIAL HISTORY: Social History   Socioeconomic History   Marital status: Married    Spouse name: Not on file   Number of children: Not on file   Years of education: Not on file   Highest education level: Not on file  Occupational History   Not on file  Tobacco Use   Smoking status: Never   Smokeless tobacco: Never  Vaping Use   Vaping status: Never Used  Substance and Sexual Activity   Alcohol use: Yes    Alcohol/week: 0.0 standard drinks of  alcohol    Comment: socially   Drug use: No   Sexual activity: Not on file  Other Topics Concern   Not on file  Social History Narrative   Not on file   Social Drivers of Health   Financial Resource Strain: Not on file  Food Insecurity: Not on file  Transportation Needs: Not on file  Physical Activity: Not on file  Stress: Not on file  Social Connections: Not on file  Intimate Partner Violence: Not on file    PHYSICAL EXAM  GENERAL EXAM/CONSTITUTIONAL: Vitals:  Vitals:   09/24/23 1459 09/24/23 1523  BP: (!) 158/90 138/74  Pulse: (!) 116 85  SpO2:  97%  Weight: 147 lb (66.7 kg)   Height: 5' (  1.524 m)    Body mass index is 28.71 kg/m. Wt Readings from Last 3 Encounters:  09/24/23 147 lb (66.7 kg)  02/07/21 156 lb 1.4 oz (70.8 kg)  02/04/21 156 lb (70.8 kg)   Patient is in no distress; well developed, nourished and groomed; neck is supple  MUSCULOSKELETAL: Gait, strength, tone, movements noted in Neurologic exam below  NEUROLOGIC: MENTAL STATUS:     09/24/2023    3:09 PM  MMSE - Mini Mental State Exam  Orientation to time 5  Orientation to Place 4  Registration 3  Attention/ Calculation 1  Recall 1  Language- name 2 objects 2  Language- repeat 1  Language- follow 3 step command 3  Language- read & follow direction 1  Write a sentence 1  Copy design 1  Total score 23    CRANIAL NERVE:  2nd, 3rd, 4th, 6th- visual fields full to confrontation, extraocular muscles intact, no nystagmus 5th - facial sensation symmetric 7th - facial strength symmetric 8th - hearing intact 9th - palate elevates symmetrically, uvula midline 11th - shoulder shrug symmetric 12th - tongue protrusion midline  MOTOR:  normal bulk and tone, full strength in the BUE, BLE  SENSORY:  normal and symmetric to light touch  COORDINATION:  finger-nose-finger, fine finger movements normal  GAIT/STATION:  normal   DIAGNOSTIC DATA (LABS, IMAGING, TESTING) - I reviewed patient  records, labs, notes, testing and imaging myself where available.  Lab Results  Component Value Date   WBC 11.9 (H) 10/08/2022   HGB 13.5 10/08/2022   HCT 40.8 10/08/2022   MCV 99.3 10/08/2022   PLT 271 10/08/2022      Component Value Date/Time   NA 130 (L) 10/08/2022 1418   K 3.9 10/08/2022 1418   CL 96 (L) 10/08/2022 1418   CO2 24 10/08/2022 1418   GLUCOSE 136 (H) 10/08/2022 1418   BUN 11 10/08/2022 1418   CREATININE 0.69 10/08/2022 1418   CALCIUM 9.2 10/08/2022 1418   PROT 7.3 10/08/2022 1418   ALBUMIN 4.4 10/08/2022 1418   AST 19 10/08/2022 1418   ALT 17 10/08/2022 1418   ALKPHOS 53 10/08/2022 1418   BILITOT 0.6 10/08/2022 1418   GFRNONAA >60 10/08/2022 1418   GFRAA  08/20/2008 0427    >60        The eGFR has been calculated using the MDRD equation. This calculation has not been validated in all clinical situations. eGFR's persistently <60 mL/min signify possible Chronic Kidney Disease.   No results found for: "CHOL", "HDL", "LDLCALC", "LDLDIRECT", "TRIG", "CHOLHDL" No results found for: "HGBA1C" No results found for: "VITAMINB12" No results found for: "TSH"  MRI Brain 06/29/2023 *No acute intracranial abnormality. *Extensive periventricular ischemic leuko encephalomalacia secondary to small vessel disease  B 12  190 TSH  0.59  ASSESSMENT AND PLAN  78 y.o. year old female with with history of hypertension, hyperlipidemia, hypothyroidism, rheumatoid arthritis who is presenting with memory loss described as forgetful, repetitive.  Husband tells me the memory loss has been going on for the past 2 years and getting worse.  On exam patient scored a 23 on MMSE indicative of impairment.  She is however independent in all ADLs.  Her brain MRI show extensive chronic microvascular ischemic changes.  Her most recent labs showed normal TSH but her B12 level was low at 190.  Will plan for B12 supplementation and obtain ATN profile to look for Alzheimer disease biomarkers.   In the case the ATN is negative, another consideration  would be vascular dementia based on MRI findings.  She is already on memantine, she tried Aricept in the past but could not tolerate it, will give her a trial of rivastigmine low-dose.  I will contact her to go over the results otherwise we will see her in 1 year for follow-up or sooner if worse.   1. Mild cognitive impairment   2. Hypertension, unspecified type   3. Hypercholesterolemia   4. Vitamin B 12 deficiency      Patient Instructions  Recent labs with TSH 0.59 and B12 190, will start B12 supplementation and obtain ATN profile  Will start patient on Rivastigmine 1.5 mg twice daily, discussed side effect of the medication and patient will stop the medication and contact me if any side effects Continue your other medications  Continue to follow up with PCP  Return in 1 year  for follow up or sooner if worse     There are well-accepted and sensible ways to reduce risk for Alzheimers disease and other degenerative brain disorders .  Exercise Daily Walk A daily 20 minute walk should be part of your routine. Disease related apathy can be a significant roadblock to exercise and the only way to overcome this is to make it a daily routine and perhaps have a reward at the end (something your loved one loves to eat or drink perhaps) or a personal trainer coming to the home can also be very useful. Most importantly, the patient is much more likely to exercise if the caregiver / spouse does it with him/her. In general a structured, repetitive schedule is best.  General Health: Any diseases which effect your body will effect your brain such as a pneumonia, urinary infection, blood clot, heart attack or stroke. Keep contact with your primary care doctor for regular follow ups.  Sleep. A good nights sleep is healthy for the brain. Seven hours is recommended. If you have insomnia or poor sleep habits we can give you some instructions. If you have  sleep apnea wear your mask.  Diet: Eating a heart healthy diet is also a good idea; fish and poultry instead of red meat, nuts (mostly non-peanuts), vegetables, fruits, olive oil or canola oil (instead of butter), minimal salt (use other spices to flavor foods), whole grain rice, bread, cereal and pasta and wine in moderation.Research is now showing that the MIND diet, which is a combination of The Mediterranean diet and the DASH diet, is beneficial for cognitive processing and longevity. Information about this diet can be found in The MIND Diet, a book by Andria Keeler, MS, RDN, and online at [employer]  Finances, Power of [address] and Advance Directives: You should consider putting legal safeguards in place with regard to financial and medical decision making. While the spouse always has power of attorney for medical and financial issues in the absence of any form, you should consider what you want in case the spouse / caregiver is no longer around or capable of making decisions.   Orders Placed This Encounter  Procedures   ATN PROFILE    Meds ordered this encounter  Medications   rivastigmine (EXELON) 1.5 MG capsule    Sig: Take 1 capsule (1.5 mg total) by mouth 2 (two) times daily.    Dispense:  60 capsule    Refill:  11   cyanocobalamin (VITAMIN B12) 1000 MCG tablet    Sig: Take 1 tablet (1,000 mcg total) by mouth daily.    Dispense:  90 tablet  Refill:  3    Return in about 1 year (around 09/23/2024).  I have spent a total of 65 minutes dedicated to this patient today, preparing to see patient, performing a medically appropriate examination and evaluation, ordering tests and/or medications and procedures, and counseling and educating the patient/family/caregiver; independently interpreting result and communicating results to the family/patient/caregiver; and documenting clinical information in the electronic medical record.   Cassandra Cleveland, MD  09/29/2023, 8:42 AM  [facility] [address], [address] [place], [place] [address] [phone]

## 2023-09-25 MED ORDER — RIVASTIGMINE TARTRATE 1.5 MG PO CAPS: 1.5000 mg | ORAL_CAPSULE | Freq: Two times a day (BID) | ORAL | 11 refills | Status: AC

## 2023-09-25 MED ORDER — VITAMIN B-12 1000 MCG PO TABS: 1000.0000 ug | ORAL_TABLET | Freq: Every day | ORAL | 3 refills | Status: AC

## 2023-09-25 NOTE — Patient Instructions (Addendum)
 Recent labs with TSH 0.59 and B12 190, will start B12 supplementation and obtain ATN profile  Will start patient on Rivastigmine 1.5 mg twice daily, discussed side effect of the medication and patient will stop the medication and contact me if any side effects Continue your other medications  Continue to follow up with PCP  Return in 1 year  for follow up or sooner if worse     There are well-accepted and sensible ways to reduce risk for Alzheimers disease and other degenerative brain disorders .  Exercise Daily Walk A daily 20 minute walk should be part of your routine. Disease related apathy can be a significant roadblock to exercise and the only way to overcome this is to make it a daily routine and perhaps have a reward at the end (something your loved one loves to eat or drink perhaps) or a personal trainer coming to the home can also be very useful. Most importantly, the patient is much more likely to exercise if the caregiver / spouse does it with him/her. In general a structured, repetitive schedule is best.  General Health: Any diseases which effect your body will effect your brain such as a pneumonia, urinary infection, blood clot, heart attack or stroke. Keep contact with your primary care doctor for regular follow ups.  Sleep. A good nights sleep is healthy for the brain. Seven hours is recommended. If you have insomnia or poor sleep habits we can give you some instructions. If you have sleep apnea wear your mask.  Diet: Eating a heart healthy diet is also a good idea; fish and poultry instead of red meat, nuts (mostly non-peanuts), vegetables, fruits, olive oil or canola oil (instead of butter), minimal salt (use other spices to flavor foods), whole grain rice, bread, cereal and pasta and wine in moderation.Research is now showing that the MIND diet, which is a combination of The Mediterranean diet and the DASH diet, is beneficial for cognitive processing and longevity. Information about  this diet can be found in The MIND Diet, a book by Andria Keeler, MS, RDN, and online at [employer]  Finances, Power of [address] and Advance Directives: You should consider putting legal safeguards in place with regard to financial and medical decision making. While the spouse always has power of attorney for medical and financial issues in the absence of any form, you should consider what you want in case the spouse / caregiver is no longer around or capable of making decisions.

## 2023-10-02 ENCOUNTER — Ambulatory Visit: Payer: Self-pay | Admitting: Neurology

## 2023-10-02 LAB — ATN PROFILE
A -- Beta-amyloid 42/40 Ratio: 0.099 — ABNORMAL LOW (ref >0.102)
Beta-amyloid 40: 158.61 pg/mL
Beta-amyloid 42: 15.68 pg/mL
N -- NfL, Plasma: 15.1 pg/mL — ABNORMAL HIGH (ref 0.00–6.04)
T -- p-tau181: 1.66 pg/mL — ABNORMAL HIGH (ref 0.00–0.97)

## 2023-10-05 ENCOUNTER — Encounter: Payer: Self-pay | Admitting: Neurology

## 2023-10-05 NOTE — Progress Notes (Signed)
 Please call and advise the patient/family that her test results show presence of Alzheimer disease biomarker. The memory loss that she is having is likely from Alzheimer disease. Please inform them that we are scheduling them on June 27 to discuss next steps.  Please remind patient to keep any upcoming appointments or tests and to call us  with any interim questions, concerns, problems or updates. Thanks,   Cassandra Cleveland, MD

## 2023-10-05 NOTE — Telephone Encounter (Signed)
 Please schedule patient for June 27 and add her to the cancellation list. Thanks

## 2023-10-06 NOTE — Telephone Encounter (Signed)
-----   Message from Cassandra Cleveland sent at 10/05/2023  6:25 PM EDT ----- Please call and advise the patient/family that her test results show presence of Alzheimer disease biomarker. The memory loss that she is having is likely from Alzheimer disease. Please inform them that we are scheduling them on June 27 to discuss next steps.  Please remind patient to keep any upcoming appointments or tests and to call us  with any interim questions, concerns, problems or updates. Thanks,   Cassandra Cleveland, MD

## 2023-10-06 NOTE — Telephone Encounter (Signed)
 Called pt, reviewed results, pt verbalized understanding and agreeable to upcoming appointment

## 2023-10-30 ENCOUNTER — Ambulatory Visit: Admitting: Neurology

## 2023-10-30 ENCOUNTER — Encounter: Payer: Self-pay | Admitting: Neurology

## 2023-10-30 VITALS — BP 123/80 | HR 86 | Ht 60.0 in | Wt 147.0 lb

## 2023-10-30 DIAGNOSIS — E538 Deficiency of other specified B group vitamins: Secondary | ICD-10-CM | POA: Diagnosis not present

## 2023-10-30 DIAGNOSIS — R269 Unspecified abnormalities of gait and mobility: Secondary | ICD-10-CM | POA: Diagnosis not present

## 2023-10-30 DIAGNOSIS — G309 Alzheimer's disease, unspecified: Secondary | ICD-10-CM

## 2023-10-30 DIAGNOSIS — W19XXXD Unspecified fall, subsequent encounter: Secondary | ICD-10-CM

## 2023-10-30 DIAGNOSIS — G3184 Mild cognitive impairment, so stated: Secondary | ICD-10-CM

## 2023-10-30 MED ORDER — GABAPENTIN 100 MG PO CAPS: 100.0000 mg | ORAL_CAPSULE | Freq: Every day | ORAL | 0 refills | Status: AC

## 2023-10-30 MED ORDER — MEMANTINE HCL 5 MG PO TABS: 5.0000 mg | ORAL_TABLET | Freq: Two times a day (BID) | ORAL | 3 refills | Status: AC

## 2023-10-30 NOTE — Progress Notes (Signed)
 [facility]  PATIENT: Vanessa Heath DOB: 1945-11-06  REQUESTING CLINICIAN: Claudene Pellet, MD HISTORY FROM: Patient/Husband  REASON FOR VISIT: Memory loss    HISTORICAL  CHIEF COMPLAINT:  Chief Complaint  Patient presents with   Follow-up    Pt in room 12. Husband Alm in room. Here to review labs results.   INTERVAL HISTORY 10/30/2023 Patient presents today for follow-up, she is accompanied by her husband.  At least last visit plan was to obtain ATN profile.  It did come back positive showing for Alzheimer disease pathology.  She was already on Namenda, we added Exelon  because she could not tolerate Aricept.  Husband tells me with the Exelon , her memory and mentation are much better, even her friends have noticed that.  She reports that she is stable.  Has not any major questions or concern at the moment. She noted an improvement in her mood and memory. We have started B 12 supplementation  She has not had any new falls since last visit.     HISTORY OF PRESENT ILLNESS:  This is 77 year old woman past medical history of hypertension, hypothyroidism, rheumatoid arthritis who is presenting with memory problem.  Patient tells me that she noted that she forgetful, sometimes relying on her husband.  She is independent in all actives of daily living, still drives, but only short distance with the use of GPS. Husband tells me that patient memory loss has been going on for many years, he noticed it during COVID time but it has been getting worse in the past 2 years.  Husband noted the patient is repetitive, asks the same questions, she does have a poor short-term memory.  She is not interested in things that she enjoyed in the past.  Does not stay connected with friends.  She does have difficulty figuring things out, it takes her more time to process information.  She is not exercising anymore.  Husband is also mentioning that her children's are concerned.  She has a  sister with dementia. She is already on Memantine, could not tolerate Aricept.     TBI: Yes, from multiple falls  Stroke: no past history of stroke Seizures:  no past history of seizures Sleep:  no history of sleep apnea.  Mood:  patient denies anxiety and depression Family history of Dementia: Sister with dementia   Functional status: independent in all ADLs and IADLs Patient lives with husband. Cooking: not much  Cleaning: yes Shopping: yes Bathing: no problem Toileting: no problem Driving: yes, with the GPS  Bills: yes Medications: yes Ever left the stove on by accident?: denies Forget how to use items around the house?: denies Getting lost going to familiar places?: denies Forgetting loved ones names?: denies Word finding difficulty? Yes  Sleep: Good    OTHER MEDICAL CONDITIONS: Hypertension, Rheumatoid arthritis, Hypothryoidism    REVIEW OF SYSTEMS: Full 14 system review of systems performed and negative with exception of: As noted in the HPI  ALLERGIES: No Known Allergies  HOME MEDICATIONS: Outpatient Medications Prior to Visit  Medication Sig Dispense Refill   acetaminophen  (TYLENOL ) 650 MG CR tablet Take 650 mg by mouth every 8 (eight) hours as needed for pain.     amLODipine  (NORVASC ) 2.5 MG tablet Take 2.5 mg by mouth daily.     cyanocobalamin (VITAMIN B12) 1000 MCG tablet Take 1 tablet (1,000 mcg total) by mouth daily. 90 tablet 3   ezetimibe  (ZETIA ) 10 MG tablet Take 10 mg by mouth daily.  0  folic acid (FOLVITE) 1 MG tablet Take 1 mg by mouth daily.     hydroxychloroquine (PLAQUENIL) 200 MG tablet Take 200 mg by mouth daily.     levothyroxine  (SYNTHROID ) 125 MCG tablet Take 125 mcg by mouth daily before breakfast.     lisinopril (PRINIVIL,ZESTRIL) 20 MG tablet Take 20 mg by mouth daily.     Methotrexate Sodium (METHOTREXATE, PF,) 50 MG/2ML injection Inject into the vein. Patient and husband cannot remember if the dose is 25 mg or 50 mg.     rivastigmine   (EXELON ) 1.5 MG capsule Take 1 capsule (1.5 mg total) by mouth 2 (two) times daily. 60 capsule 11   simvastatin  (ZOCOR ) 20 MG tablet Take 20 mg by mouth daily.  0   gabapentin  (NEURONTIN ) 300 MG capsule Take 300 mg by mouth daily.     memantine (NAMENDA) 5 MG tablet Take 7 mg by mouth daily.     gabapentin  (NEURONTIN ) 300 MG capsule Take 600 mg by mouth at bedtime.  0   No facility-administered medications prior to visit.    PAST MEDICAL HISTORY: Past Medical History:  Diagnosis Date   Actinic keratosis    Arthritis    Attention deficit disorder (ADD) in adult    Basal cell carcinoma    Elevated LDH    Hypertension    Hypothyroidism     PAST SURGICAL HISTORY: Past Surgical History:  Procedure Laterality Date   EYE SURGERY Bilateral 2022   REPLACEMENT TOTAL HIP W/  RESURFACING IMPLANTS     skin cancer removal     TOTAL HIP ARTHROPLASTY N/A 02/06/2021   Procedure: TOTAL HIP ARTHROPLASTY ANTERIOR APPROACH;  Surgeon: Melodi Lerner, MD;  Location: [facility];  Service: Orthopedics;  Laterality: N/A;    FAMILY HISTORY: Family History  Problem Relation Age of Onset   Breast cancer Mother    Breast cancer Sister    Breast cancer Maternal Grandmother     SOCIAL HISTORY: Social History   Socioeconomic History   Marital status: Married    Spouse name: Not on file   Number of children: Not on file   Years of education: Not on file   Highest education level: Not on file  Occupational History   Not on file  Tobacco Use   Smoking status: Never   Smokeless tobacco: Never  Vaping Use   Vaping status: Never Used  Substance and Sexual Activity   Alcohol use: Yes    Alcohol/week: 0.0 standard drinks of alcohol    Comment: socially   Drug use: No   Sexual activity: Not on file  Other Topics Concern   Not on file  Social History Narrative   Not on file   Social Drivers of Health   Financial Resource Strain: Not on file  Food Insecurity: Not on file  Transportation Needs:  Not on file  Physical Activity: Not on file  Stress: Not on file  Social Connections: Not on file  Intimate Partner Violence: Not on file    PHYSICAL EXAM  GENERAL EXAM/CONSTITUTIONAL: Vitals:  Vitals:   10/30/23 0810  BP: 123/80  Pulse: 86  SpO2: 98%  Weight: 147 lb (66.7 kg)  Height: 5' (1.524 m)    Body mass index is 28.71 kg/m. Wt Readings from Last 3 Encounters:  10/30/23 147 lb (66.7 kg)  09/24/23 147 lb (66.7 kg)  02/07/21 156 lb 1.4 oz (70.8 kg)   Patient is in no distress; well developed, nourished and groomed; neck is supple  MUSCULOSKELETAL:  Gait, strength, tone, movements noted in Neurologic exam below  NEUROLOGIC: MENTAL STATUS:     09/24/2023    3:09 PM  MMSE - Mini Mental State Exam  Orientation to time 5  Orientation to Place 4  Registration 3  Attention/ Calculation 1  Recall 1  Language- name 2 objects 2  Language- repeat 1  Language- follow 3 step command 3  Language- read & follow direction 1  Write a sentence 1  Copy design 1  Total score 23    CRANIAL NERVE:  2nd, 3rd, 4th, 6th- visual fields full to confrontation, extraocular muscles intact, no nystagmus 5th - facial sensation symmetric 7th - facial strength symmetric 8th - hearing intact 9th - palate elevates symmetrically, uvula midline 11th - shoulder shrug symmetric 12th - tongue protrusion midline  MOTOR:  normal bulk and tone, full strength in the BUE, BLE  SENSORY:  normal and symmetric to light touch  COORDINATION:  finger-nose-finger, fine finger movements normal  GAIT/STATION:  normal   DIAGNOSTIC DATA (LABS, IMAGING, TESTING) - I reviewed patient records, labs, notes, testing and imaging myself where available.  Lab Results  Component Value Date   WBC 11.9 (H) 10/08/2022   HGB 13.5 10/08/2022   HCT 40.8 10/08/2022   MCV 99.3 10/08/2022   PLT 271 10/08/2022      Component Value Date/Time   NA 130 (L) 10/08/2022 1418   K 3.9 10/08/2022 1418   CL 96  (L) 10/08/2022 1418   CO2 24 10/08/2022 1418   GLUCOSE 136 (H) 10/08/2022 1418   BUN 11 10/08/2022 1418   CREATININE 0.69 10/08/2022 1418   CALCIUM 9.2 10/08/2022 1418   PROT 7.3 10/08/2022 1418   ALBUMIN 4.4 10/08/2022 1418   AST 19 10/08/2022 1418   ALT 17 10/08/2022 1418   ALKPHOS 53 10/08/2022 1418   BILITOT 0.6 10/08/2022 1418   GFRNONAA >60 10/08/2022 1418   GFRAA  08/20/2008 0427    >60        The eGFR has been calculated using the MDRD equation. This calculation has not been validated in all clinical situations. eGFR's persistently <60 mL/min signify possible Chronic Kidney Disease.   No results found for: CHOL, HDL, LDLCALC, LDLDIRECT, TRIG, CHOLHDL No results found for: YHAJ8R No results found for: VITAMINB12 No results found for: TSH  MRI Brain 06/29/2023 *No acute intracranial abnormality. *Extensive periventricular ischemic leuko encephalomalacia secondary to small vessel disease  B 12  190 TSH  0.59  ATN profile consistent with Alzheimer disease pathology.   ASSESSMENT AND PLAN  78 y.o. year old female with with history of hypertension, hyperlipidemia, hypothyroidism, rheumatoid arthritis who is presenting for follow-up for her MCI due to Alzheimer disease.  She is doing well on Namenda and Aricept, feels like her memory is improving.  We also started supplementing her B12.  Currently she does not have any additional questions or concerns.  Plan will be to continue with Exelon , Namenda.  At next visit we will likely increase the Exelon  to 3 mg twice daily.  Advised her to continue following up with PCP, increase exercise and to contact us  for any additional question or concerns. In term of her fall and gait abnormality, I will send her to physical therapy.    1. Mild cognitive impairment (MCI) due to Alzheimer's disease (HCC)   2. Vitamin B 12 deficiency   3. Abnormal gait   4. Fall, subsequent encounter      Patient Instructions   Continue current  medications actually including Exelon  and Namenda Decrease gabapentin  to 100 mg nightly at bedtime Referral to physical therapy for gait training Follow-up as scheduled in a year or sooner if worse.  Orders Placed This Encounter  Procedures   Ambulatory referral to Physical Therapy    Meds ordered this encounter  Medications   gabapentin  (NEURONTIN ) 100 MG capsule    Sig: Take 1 capsule (100 mg total) by mouth at bedtime.    Dispense:  30 capsule    Refill:  0   memantine (NAMENDA) 5 MG tablet    Sig: Take 1 tablet (5 mg total) by mouth 2 (two) times daily.    Dispense:  180 tablet    Refill:  3    No follow-ups on file.  I have spent a total of 45 minutes dedicated to this patient today, preparing to see patient, performing a medically appropriate examination and evaluation, ordering tests and/or medications and procedures, and counseling and educating the patient/family/caregiver; independently interpreting result and communicating results to the family/patient/caregiver; and documenting clinical information in the electronic medical record.   Pastor Falling, MD 10/30/2023, 12:39 PM  [facility] [address] [place], [place] [address] [phone]

## 2023-10-30 NOTE — Patient Instructions (Addendum)
 Continue current medications actually including Exelon  and Namenda Decrease gabapentin  to 100 mg nightly at bedtime Referral to physical therapy for gait training Follow-up as scheduled in a year or sooner if worse.

## 2023-11-04 ENCOUNTER — Emergency Department (HOSPITAL_COMMUNITY)

## 2023-11-04 ENCOUNTER — Observation Stay (HOSPITAL_COMMUNITY)
Admission: EM | Admit: 2023-11-04 | Discharge: 2023-11-05 | Disposition: A | Discharge status: Home / Self Care | Attending: Physician Assistant | Admitting: Physician Assistant

## 2023-11-04 ENCOUNTER — Encounter (HOSPITAL_COMMUNITY): Payer: Self-pay

## 2023-11-04 ENCOUNTER — Other Ambulatory Visit: Payer: Self-pay

## 2023-11-04 DIAGNOSIS — F1090 Alcohol use, unspecified, uncomplicated: Secondary | ICD-10-CM | POA: Insufficient documentation

## 2023-11-04 DIAGNOSIS — R0789 Other chest pain: Secondary | ICD-10-CM | POA: Diagnosis present

## 2023-11-04 DIAGNOSIS — M069 Rheumatoid arthritis, unspecified: Secondary | ICD-10-CM | POA: Diagnosis not present

## 2023-11-04 DIAGNOSIS — S42032A Displaced fracture of lateral end of left clavicle, initial encounter for closed fracture: Secondary | ICD-10-CM | POA: Insufficient documentation

## 2023-11-04 DIAGNOSIS — S2242XA Multiple fractures of ribs, left side, initial encounter for closed fracture: Principal | ICD-10-CM | POA: Insufficient documentation

## 2023-11-04 DIAGNOSIS — R0689 Other abnormalities of breathing: Secondary | ICD-10-CM | POA: Diagnosis not present

## 2023-11-04 DIAGNOSIS — S272XXA Traumatic hemopneumothorax, initial encounter: Secondary | ICD-10-CM | POA: Insufficient documentation

## 2023-11-04 DIAGNOSIS — I700 Atherosclerosis of aorta: Secondary | ICD-10-CM | POA: Diagnosis not present

## 2023-11-04 DIAGNOSIS — S2249XA Multiple fractures of ribs, unspecified side, initial encounter for closed fracture: Secondary | ICD-10-CM

## 2023-11-04 DIAGNOSIS — S270XXA Traumatic pneumothorax, initial encounter: Secondary | ICD-10-CM

## 2023-11-04 LAB — CBC WITH DIFFERENTIAL/PLATELET
Abs Immature Granulocytes: 0.19 10*3/uL — ABNORMAL HIGH (ref 0.00–0.07)
Basophils Absolute: 0.1 10*3/uL (ref 0.0–0.1)
Basophils Relative: 1 %
Eosinophils Absolute: 0.2 10*3/uL (ref 0.0–0.5)
Eosinophils Relative: 2 %
HCT: 39.8 % (ref 36.0–46.0)
Hemoglobin: 13 g/dL (ref 12.0–15.0)
Immature Granulocytes: 1 %
Lymphocytes Relative: 9 %
Lymphs Abs: 1.3 10*3/uL (ref 0.7–4.0)
MCH: 33.9 pg (ref 26.0–34.0)
MCHC: 32.7 g/dL (ref 30.0–36.0)
MCV: 103.9 fL — ABNORMAL HIGH (ref 80.0–100.0)
Monocytes Absolute: 0.8 10*3/uL (ref 0.1–1.0)
Monocytes Relative: 6 %
Neutro Abs: 11.6 10*3/uL — ABNORMAL HIGH (ref 1.7–7.7)
Neutrophils Relative %: 81 %
Platelets: 286 10*3/uL (ref 150–400)
RBC: 3.83 MIL/uL — ABNORMAL LOW (ref 3.87–5.11)
RDW: 13.6 % (ref 11.5–15.5)
WBC: 14.3 10*3/uL — ABNORMAL HIGH (ref 4.0–10.5)
nRBC: 0 % (ref 0.0–0.2)

## 2023-11-04 LAB — BASIC METABOLIC PANEL WITH GFR
Anion gap: 12 (ref 5–15)
BUN: 18 mg/dL (ref 8–23)
CO2: 21 mmol/L — ABNORMAL LOW (ref 22–32)
Calcium: 9.6 mg/dL (ref 8.9–10.3)
Chloride: 99 mmol/L (ref 98–111)
Creatinine, Ser: 0.52 mg/dL (ref 0.44–1.00)
GFR, Estimated: >60 mL/min (ref >60)
Glucose, Bld: 127 mg/dL — ABNORMAL HIGH (ref 70–99)
Potassium: 4 mmol/L (ref 3.5–5.1)
Sodium: 132 mmol/L — ABNORMAL LOW (ref 135–145)

## 2023-11-04 MED ORDER — IOHEXOL 300 MG/ML  SOLN
75.0000 mL | Freq: Once | INTRAMUSCULAR | Status: AC | PRN
  Administered 2023-11-04: 75 mL via INTRAVENOUS

## 2023-11-04 MED ORDER — ACETAMINOPHEN 500 MG PO TABS
1000.0000 mg | ORAL_TABLET | Freq: Once | ORAL | Status: AC
  Administered 2023-11-04: 1000 mg via ORAL
  Filled 2023-11-04: qty 2
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
## 2023-11-05 ENCOUNTER — Observation Stay (HOSPITAL_COMMUNITY)

## 2023-11-05 DIAGNOSIS — M069 Rheumatoid arthritis, unspecified: Secondary | ICD-10-CM | POA: Diagnosis not present

## 2023-11-05 DIAGNOSIS — I700 Atherosclerosis of aorta: Secondary | ICD-10-CM | POA: Diagnosis not present

## 2023-11-05 DIAGNOSIS — S272XXA Traumatic hemopneumothorax, initial encounter: Secondary | ICD-10-CM | POA: Diagnosis not present

## 2023-11-05 DIAGNOSIS — S2242XA Multiple fractures of ribs, left side, initial encounter for closed fracture: Secondary | ICD-10-CM | POA: Diagnosis not present

## 2023-11-05 DIAGNOSIS — S42032A Displaced fracture of lateral end of left clavicle, initial encounter for closed fracture: Secondary | ICD-10-CM | POA: Diagnosis not present

## 2023-11-05 DIAGNOSIS — R0789 Other chest pain: Secondary | ICD-10-CM | POA: Diagnosis present

## 2023-11-05 DIAGNOSIS — F1090 Alcohol use, unspecified, uncomplicated: Secondary | ICD-10-CM | POA: Diagnosis not present

## 2023-11-05 DIAGNOSIS — R0689 Other abnormalities of breathing: Secondary | ICD-10-CM | POA: Diagnosis not present

## 2023-11-05 LAB — BASIC METABOLIC PANEL WITH GFR
Anion gap: 10 (ref 5–15)
BUN: 12 mg/dL (ref 8–23)
CO2: 22 mmol/L (ref 22–32)
Calcium: 9.4 mg/dL (ref 8.9–10.3)
Chloride: 98 mmol/L (ref 98–111)
Creatinine, Ser: 0.55 mg/dL (ref 0.44–1.00)
GFR, Estimated: >60 mL/min (ref >60)
Glucose, Bld: 133 mg/dL — ABNORMAL HIGH (ref 70–99)
Potassium: 3.7 mmol/L (ref 3.5–5.1)
Sodium: 130 mmol/L — ABNORMAL LOW (ref 135–145)

## 2023-11-05 MED ORDER — ACETAMINOPHEN 325 MG PO TABS: 325.0000 mg | ORAL_TABLET | Freq: Four times a day (QID) | ORAL | Status: AC | PRN

## 2023-11-05 MED ORDER — METOPROLOL TARTRATE 5 MG/5ML IV SOLN: 5.0000 mg | Freq: Four times a day (QID) | INTRAVENOUS | Status: DC | PRN

## 2023-11-05 MED ORDER — DOCUSATE SODIUM 100 MG PO CAPS: 100.0000 mg | ORAL_CAPSULE | Freq: Every day | ORAL | Status: AC | PRN

## 2023-11-05 MED ORDER — TRAMADOL HCL 50 MG PO TABS: 50.0000 mg | ORAL_TABLET | Freq: Four times a day (QID) | ORAL | Status: DC | PRN

## 2023-11-05 MED ORDER — POLYETHYLENE GLYCOL 3350 17 G PO PACK: 17.0000 g | PACK | Freq: Every day | ORAL | Status: AC | PRN

## 2023-11-05 MED ORDER — AMLODIPINE BESYLATE 2.5 MG PO TABS
2.5000 mg | ORAL_TABLET | Freq: Every day | ORAL | Status: DC
  Filled 2023-11-05: qty 1

## 2023-11-05 MED ORDER — HYDROCODONE-ACETAMINOPHEN 5-325 MG PO TABS: 1.0000 | ORAL_TABLET | ORAL | 0 refills | Status: AC | PRN

## 2023-11-05 MED ORDER — IBUPROFEN 200 MG PO TABS: 600.0000 mg | ORAL_TABLET | Freq: Four times a day (QID) | ORAL | Status: DC | PRN

## 2023-11-05 MED ORDER — FOLIC ACID 1 MG PO TABS
1.0000 mg | ORAL_TABLET | Freq: Every day | ORAL | Status: DC
  Administered 2023-11-05: 1 mg via ORAL
  Filled 2023-11-05: qty 1

## 2023-11-05 MED ORDER — VITAMIN B-12 1000 MCG PO TABS
1000.0000 ug | ORAL_TABLET | Freq: Every day | ORAL | Status: DC
  Administered 2023-11-05: 1000 ug via ORAL
  Filled 2023-11-05: qty 1

## 2023-11-05 MED ORDER — DOCUSATE SODIUM 100 MG PO CAPS
100.0000 mg | ORAL_CAPSULE | Freq: Two times a day (BID) | ORAL | Status: DC
  Administered 2023-11-05: 100 mg via ORAL
  Filled 2023-11-05: qty 1

## 2023-11-05 MED ORDER — ACETAMINOPHEN 325 MG PO TABS
650.0000 mg | ORAL_TABLET | Freq: Four times a day (QID) | ORAL | Status: DC
  Administered 2023-11-05: 650 mg via ORAL
  Filled 2023-11-05 (×2): qty 2

## 2023-11-05 MED ORDER — GABAPENTIN 100 MG PO CAPS: 100.0000 mg | ORAL_CAPSULE | Freq: Every day | ORAL | Status: DC

## 2023-11-05 MED ORDER — SODIUM CHLORIDE 0.9% FLUSH
3.0000 mL | Freq: Two times a day (BID) | INTRAVENOUS | Status: DC
  Administered 2023-11-05: 3 mL via INTRAVENOUS

## 2023-11-05 MED ORDER — SODIUM CHLORIDE 0.9 % IV SOLN: 250.0000 mL | INTRAVENOUS | Status: DC | PRN

## 2023-11-05 MED ORDER — SODIUM CHLORIDE 0.9% FLUSH: 3.0000 mL | INTRAVENOUS | Status: DC | PRN

## 2023-11-05 MED ORDER — LEVOTHYROXINE SODIUM 25 MCG PO TABS: 125.0000 ug | ORAL_TABLET | Freq: Every day | ORAL | Status: DC

## 2023-11-05 MED ORDER — EZETIMIBE 10 MG PO TABS
10.0000 mg | ORAL_TABLET | Freq: Every day | ORAL | Status: DC
  Filled 2023-11-05: qty 1

## 2023-11-05 MED ORDER — POLYETHYLENE GLYCOL 3350 17 G PO PACK: 17.0000 g | PACK | Freq: Every day | ORAL | Status: DC | PRN

## 2023-11-05 MED ORDER — HYDRALAZINE HCL 20 MG/ML IJ SOLN: 10.0000 mg | INTRAMUSCULAR | Status: DC | PRN

## 2023-11-05 MED ORDER — LISINOPRIL 20 MG PO TABS
20.0000 mg | ORAL_TABLET | Freq: Every day | ORAL | Status: DC
  Filled 2023-11-05: qty 1

## 2023-11-05 MED ORDER — METHOCARBAMOL 500 MG PO TABS
500.0000 mg | ORAL_TABLET | Freq: Three times a day (TID) | ORAL | Status: DC
  Administered 2023-11-05: 500 mg via ORAL
  Filled 2023-11-05: qty 1

## 2023-11-05 MED ORDER — HYDROXYCHLOROQUINE SULFATE 200 MG PO TABS: 200.0000 mg | ORAL_TABLET | Freq: Every day | ORAL | Status: DC

## 2023-11-05 MED ORDER — MEMANTINE HCL 10 MG PO TABS
5.0000 mg | ORAL_TABLET | Freq: Two times a day (BID) | ORAL | Status: DC
  Administered 2023-11-05: 5 mg via ORAL
  Filled 2023-11-05: qty 1

## 2023-11-05 MED ORDER — HYDROCODONE-ACETAMINOPHEN 5-325 MG PO TABS
1.0000 | ORAL_TABLET | ORAL | Status: DC | PRN
  Administered 2023-11-05: 1 via ORAL
  Filled 2023-11-05: qty 1

## 2023-11-05 MED ORDER — ONDANSETRON HCL 4 MG/2ML IJ SOLN: 4.0000 mg | Freq: Four times a day (QID) | INTRAMUSCULAR | Status: DC | PRN

## 2023-11-05 MED ORDER — METHOCARBAMOL 1000 MG/10ML IJ SOLN
500.0000 mg | Freq: Three times a day (TID) | INTRAMUSCULAR | Status: DC
  Administered 2023-11-05: 500 mg via INTRAVENOUS
  Filled 2023-11-05: qty 10

## 2023-11-05 MED ORDER — ENOXAPARIN SODIUM 30 MG/0.3ML IJ SOSY: 30.0000 mg | PREFILLED_SYRINGE | Freq: Two times a day (BID) | INTRAMUSCULAR | Status: DC

## 2023-11-05 MED ORDER — METHOCARBAMOL 500 MG PO TABS: 500.0000 mg | ORAL_TABLET | Freq: Three times a day (TID) | ORAL | 0 refills | Status: AC | PRN

## 2023-11-05 MED ORDER — ONDANSETRON 4 MG PO TBDP: 4.0000 mg | ORAL_TABLET | Freq: Four times a day (QID) | ORAL | Status: DC | PRN

## 2023-11-05 MED ORDER — RIVASTIGMINE TARTRATE 1.5 MG PO CAPS: 1.5000 mg | ORAL_CAPSULE | Freq: Two times a day (BID) | ORAL | Status: DC

## 2023-11-05 MED ORDER — SIMVASTATIN 20 MG PO TABS
20.0000 mg | ORAL_TABLET | Freq: Every day | ORAL | Status: DC
  Filled 2023-11-05: qty 1

## 2023-11-05 MED ORDER — IBUPROFEN 600 MG PO TABS: 600.0000 mg | ORAL_TABLET | Freq: Four times a day (QID) | ORAL | 0 refills | Status: AC | PRN

## 2023-11-05 MED ORDER — LEVOTHYROXINE SODIUM 25 MCG PO TABS
125.0000 ug | ORAL_TABLET | Freq: Every day | ORAL | Status: DC
  Administered 2023-11-05: 125 ug via ORAL
  Filled 2023-11-05: qty 1

## 2023-11-05 NOTE — ED Notes (Signed)
 No changes. Husband at [facility]. Pt out with [facility]. [facility] updated about pending pt.

## 2023-11-05 NOTE — ED Notes (Signed)
 Synthroid  given. L shoulder immobilizer applied. Husband arrives to [facility]. Pt denies need for pain/ nausea med. Denies pain at this time.

## 2023-11-05 NOTE — ED Notes (Signed)
 Pt assisted to the restroom. Ambulated with little assistance and complained of soreness but not pain

## 2023-11-05 NOTE — ED Notes (Signed)
 [facility] initiated
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
## 2023-11-05 NOTE — TOC CAGE-AID Note (Signed)
 Transition of Care [facility]) - CAGE-AID Screening   Patient Details  Name: Vanessa Heath MRN: [id number] Date of Birth: 1945/12/30  [facility]) CM/SW Contact:    Hetal Proano E Xaivier Malay, LCSW Phone Number: 11/05/2023, 2:56 PM   Clinical Narrative: Patient reports she drinks wine once every 3 weeks for wine club. Denied other substance use. Denied SA resource needs.   CAGE-AID Screening:    Have You Ever Felt You Ought to Cut Down on Your Drinking or Drug Use?: No Have People Annoyed You By Critizing Your Drinking Or Drug Use?: No Have You Felt Bad Or Guilty About Your Drinking Or Drug Use?: No Have You Ever Had a Drink or Used Drugs First Thing In The Morning to Steady Your Nerves or to Get Rid of a Hangover?: No CAGE-AID Score: 0  Substance Abuse Education Offered: Yes
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
## 2023-11-05 NOTE — ED Notes (Signed)
 Breakfast tray was given.

## 2023-12-04 ENCOUNTER — Encounter: Payer: Self-pay | Admitting: Physical Therapy

## 2023-12-04 ENCOUNTER — Ambulatory Visit: Attending: Neurology | Admitting: Physical Therapy

## 2023-12-04 DIAGNOSIS — R2681 Unsteadiness on feet: Secondary | ICD-10-CM | POA: Insufficient documentation

## 2023-12-04 DIAGNOSIS — W19XXXA Unspecified fall, initial encounter: Secondary | ICD-10-CM | POA: Insufficient documentation

## 2023-12-04 DIAGNOSIS — R269 Unspecified abnormalities of gait and mobility: Secondary | ICD-10-CM | POA: Diagnosis not present

## 2023-12-04 DIAGNOSIS — R2689 Other abnormalities of gait and mobility: Secondary | ICD-10-CM | POA: Insufficient documentation

## 2023-12-04 DIAGNOSIS — M6281 Muscle weakness (generalized): Secondary | ICD-10-CM | POA: Insufficient documentation

## 2023-12-04 NOTE — Patient Instructions (Signed)
 You Can Walk For A Certain Length Of Time Each Day                          Walk 10 minutes 1 times per day.             Increase 1-2  minutes every week             Work up to 20 minutes (1-2 times per day).               Example:                         Day 1-2           4-5 minutes     3 times per day                         Day 7-8           10-12 minutes 2-3 times per day                         Day 13-14       20-22 minutes 1-2 times per day
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
## 2023-12-09 ENCOUNTER — Ambulatory Visit (HOSPITAL_BASED_OUTPATIENT_CLINIC_OR_DEPARTMENT_OTHER)
Admission: RE | Admit: 2023-12-09 | Discharge: 2023-12-09 | Disposition: A | Discharge status: Home / Self Care | Source: Ambulatory Visit | Attending: Family Medicine | Admitting: Family Medicine

## 2023-12-09 DIAGNOSIS — E2839 Other primary ovarian failure: Secondary | ICD-10-CM | POA: Insufficient documentation

## 2023-12-11 ENCOUNTER — Ambulatory Visit: Admitting: Physical Therapy

## 2023-12-11 ENCOUNTER — Encounter: Payer: Self-pay | Admitting: Physical Therapy

## 2023-12-11 DIAGNOSIS — M6281 Muscle weakness (generalized): Secondary | ICD-10-CM

## 2023-12-11 DIAGNOSIS — R2689 Other abnormalities of gait and mobility: Secondary | ICD-10-CM

## 2023-12-11 DIAGNOSIS — R2681 Unsteadiness on feet: Secondary | ICD-10-CM
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
## 2023-12-11 NOTE — Patient Instructions (Signed)
 Access Code: [email or web address] URL: [email or web address] Date: 12/11/2023 Prepared by: Daved Bull  Exercises - Tandem Walking with Counter Support  - 1 x daily - 4 x weekly - 3 sets - 10 reps - Backward Tandem Walking with Counter Support  - 1 x daily - 4 x weekly - 3 sets - 10 reps - Walking with Eyes Closed and Counter Support  - 1 x daily - 4 x weekly - 3 sets - 10 reps - Backward Walking with Eyes Closed and Counter Support  - 1 x daily - 4 x weekly - 3 sets - 10 reps - Corner Balance Feet Together With Eyes Closed  - 1 x daily - 4 x weekly - 1 sets - 3 reps - 45 seconds hold - Side Stepping with Resistance at Thighs and Counter Support  - 1 x daily - 4 x weekly - 3 sets - 10 reps - Walking with Head Nod  - 1 x daily - 4 x weekly - 3 sets - 10 reps

## 2023-12-14 ENCOUNTER — Encounter: Payer: Self-pay | Admitting: Physical Therapy

## 2023-12-14 ENCOUNTER — Ambulatory Visit: Admitting: Physical Therapy

## 2023-12-14 DIAGNOSIS — R2689 Other abnormalities of gait and mobility: Secondary | ICD-10-CM | POA: Diagnosis not present

## 2023-12-14 DIAGNOSIS — R2681 Unsteadiness on feet: Secondary | ICD-10-CM

## 2023-12-14 DIAGNOSIS — M6281 Muscle weakness (generalized): Secondary | ICD-10-CM

## 2023-12-14 NOTE — Patient Instructions (Signed)
-   Bird Dog on Counter  - 1 x daily - 4 x weekly - 1-2 sets - 10 reps - Supine March  - 1 x daily - 4 x weekly - 3 sets - 10 reps
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
## 2023-12-18 ENCOUNTER — Encounter: Payer: Self-pay | Admitting: Physical Therapy

## 2023-12-18 ENCOUNTER — Ambulatory Visit: Admitting: Physical Therapy

## 2023-12-18 DIAGNOSIS — R2681 Unsteadiness on feet: Secondary | ICD-10-CM

## 2023-12-18 DIAGNOSIS — R2689 Other abnormalities of gait and mobility: Secondary | ICD-10-CM

## 2023-12-18 DIAGNOSIS — M6281 Muscle weakness (generalized): Secondary | ICD-10-CM
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
## 2023-12-21 ENCOUNTER — Encounter: Payer: Self-pay | Admitting: Physical Therapy

## 2023-12-21 ENCOUNTER — Ambulatory Visit: Admitting: Physical Therapy

## 2023-12-21 DIAGNOSIS — R2689 Other abnormalities of gait and mobility: Secondary | ICD-10-CM | POA: Diagnosis not present

## 2023-12-21 DIAGNOSIS — M6281 Muscle weakness (generalized): Secondary | ICD-10-CM

## 2023-12-21 DIAGNOSIS — R2681 Unsteadiness on feet: Secondary | ICD-10-CM
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
## 2023-12-25 ENCOUNTER — Encounter: Payer: Self-pay | Admitting: Physical Therapy

## 2023-12-25 ENCOUNTER — Ambulatory Visit: Admitting: Physical Therapy

## 2023-12-25 DIAGNOSIS — R2689 Other abnormalities of gait and mobility: Secondary | ICD-10-CM

## 2023-12-25 DIAGNOSIS — R2681 Unsteadiness on feet: Secondary | ICD-10-CM

## 2023-12-25 DIAGNOSIS — M6281 Muscle weakness (generalized): Secondary | ICD-10-CM
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
## 2023-12-28 ENCOUNTER — Encounter: Payer: Self-pay | Admitting: Physical Therapy

## 2023-12-28 ENCOUNTER — Ambulatory Visit: Admitting: Physical Therapy

## 2023-12-28 DIAGNOSIS — R2681 Unsteadiness on feet: Secondary | ICD-10-CM

## 2023-12-28 DIAGNOSIS — R2689 Other abnormalities of gait and mobility: Secondary | ICD-10-CM

## 2023-12-28 DIAGNOSIS — M6281 Muscle weakness (generalized): Secondary | ICD-10-CM
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
## 2023-12-28 NOTE — Patient Instructions (Addendum)
 Access Code: [email or web address] URL: [email or web address] Date: 12/28/2023 Prepared by: Daved Bull  Exercises - Tandem Walking with Counter Support  - 1 x daily - 4 x weekly - 3 sets - 10 reps - Backward Tandem Walking with Counter Support  - 1 x daily - 4 x weekly - 3 sets - 10 reps - Walking with Eyes Closed and Counter Support  - 1 x daily - 4 x weekly - 3 sets - 10 reps - Backward Walking with Eyes Closed and Counter Support  - 1 x daily - 4 x weekly - 3 sets - 10 reps - Corner Balance Feet Together With Eyes Closed  - 1 x daily - 4 x weekly - 1 sets - 3 reps - 45 seconds hold - Side Stepping with Resistance at Thighs and Counter Support  - 1 x daily - 4 x weekly - 3 sets - 10 reps- upgraded to green theraband - Walking with Head Nod  - 1 x daily - 4 x weekly - 3 sets - 10 reps - Bird Dog on Counter  - 1 x daily - 4 x weekly - 1-2 sets - 10 reps - Supine March  - 1 x daily - 4 x weekly - 3 sets - 10 reps - Supine Bridge  - 1 x daily - 4 x weekly - 2 sets - 10 reps - Supine Lower Trunk Rotation  - 1 x daily - 4 x weekly - 2-3 sets - 10 reps - Walking with Head Rotation  - 1 x daily - 4 x weekly - 3 sets - 10 reps

## 2023-12-30 ENCOUNTER — Ambulatory Visit: Admitting: Physical Therapy

## 2024-01-01 ENCOUNTER — Ambulatory Visit: Admitting: Physical Therapy

## 2024-01-01 ENCOUNTER — Encounter: Payer: Self-pay | Admitting: Physical Therapy

## 2024-01-01 DIAGNOSIS — M6281 Muscle weakness (generalized): Secondary | ICD-10-CM

## 2024-01-01 DIAGNOSIS — R2689 Other abnormalities of gait and mobility: Secondary | ICD-10-CM | POA: Diagnosis not present

## 2024-01-01 DIAGNOSIS — R2681 Unsteadiness on feet: Secondary | ICD-10-CM
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
## 2024-01-15 ENCOUNTER — Ambulatory Visit: Attending: Neurology | Admitting: Physical Therapy

## 2024-01-15 ENCOUNTER — Encounter: Payer: Self-pay | Admitting: Physical Therapy

## 2024-01-15 DIAGNOSIS — M6281 Muscle weakness (generalized): Secondary | ICD-10-CM | POA: Diagnosis present

## 2024-01-15 DIAGNOSIS — R2689 Other abnormalities of gait and mobility: Secondary | ICD-10-CM | POA: Insufficient documentation

## 2024-01-15 DIAGNOSIS — R2681 Unsteadiness on feet: Secondary | ICD-10-CM | POA: Insufficient documentation
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
## 2024-01-22 ENCOUNTER — Ambulatory Visit: Admitting: Physical Therapy

## 2024-01-22 ENCOUNTER — Encounter: Payer: Self-pay | Admitting: Physical Therapy

## 2024-01-22 DIAGNOSIS — R2689 Other abnormalities of gait and mobility: Secondary | ICD-10-CM

## 2024-01-22 DIAGNOSIS — R2681 Unsteadiness on feet: Secondary | ICD-10-CM

## 2024-01-22 DIAGNOSIS — M6281 Muscle weakness (generalized): Secondary | ICD-10-CM
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
## 2024-01-29 ENCOUNTER — Encounter: Payer: Self-pay | Admitting: Physical Therapy

## 2024-01-29 ENCOUNTER — Ambulatory Visit: Admitting: Physical Therapy

## 2024-01-29 DIAGNOSIS — M6281 Muscle weakness (generalized): Secondary | ICD-10-CM

## 2024-01-29 DIAGNOSIS — R2689 Other abnormalities of gait and mobility: Secondary | ICD-10-CM | POA: Diagnosis not present

## 2024-01-29 DIAGNOSIS — R2681 Unsteadiness on feet: Secondary | ICD-10-CM
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
## 2024-02-03 ENCOUNTER — Encounter: Payer: Self-pay | Admitting: Family Medicine

## 2024-02-03 DIAGNOSIS — Z1231 Encounter for screening mammogram for malignant neoplasm of breast: Secondary | ICD-10-CM

## 2024-02-05 ENCOUNTER — Ambulatory Visit: Attending: Neurology

## 2024-02-05 VITALS — BP 138/101

## 2024-02-05 DIAGNOSIS — M6281 Muscle weakness (generalized): Secondary | ICD-10-CM | POA: Diagnosis present

## 2024-02-05 DIAGNOSIS — R2681 Unsteadiness on feet: Secondary | ICD-10-CM | POA: Insufficient documentation

## 2024-02-05 DIAGNOSIS — R2689 Other abnormalities of gait and mobility: Secondary | ICD-10-CM | POA: Diagnosis present
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
## 2024-02-08 ENCOUNTER — Other Ambulatory Visit: Payer: Medicare Other

## 2024-02-15 ENCOUNTER — Other Ambulatory Visit: Payer: Self-pay | Admitting: Family Medicine

## 2024-02-15 DIAGNOSIS — Z1231 Encounter for screening mammogram for malignant neoplasm of breast: Secondary | ICD-10-CM

## 2024-02-16 ENCOUNTER — Ambulatory Visit
Admission: RE | Admit: 2024-02-16 | Discharge: 2024-02-16 | Disposition: A | Discharge status: Home / Self Care | Source: Ambulatory Visit | Attending: Family Medicine | Admitting: Family Medicine

## 2024-02-16 DIAGNOSIS — Z1231 Encounter for screening mammogram for malignant neoplasm of breast: Secondary | ICD-10-CM

## 2024-09-29 ENCOUNTER — Ambulatory Visit: Admitting: Neurology
